# Patient Record
Sex: Male | Born: 1935 | Race: White | Hispanic: No | Smoking: Current every day smoker
Health system: Southern US, Community
[De-identification: ages and names within clinical notes are randomized; demographics above are authoritative.]

## PROBLEM LIST (undated history)

## (undated) DIAGNOSIS — N400 Enlarged prostate without lower urinary tract symptoms: Secondary | ICD-10-CM

## (undated) DIAGNOSIS — C3490 Malignant neoplasm of unspecified part of unspecified bronchus or lung: Secondary | ICD-10-CM

## (undated) DIAGNOSIS — J449 Chronic obstructive pulmonary disease, unspecified: Secondary | ICD-10-CM

## (undated) DIAGNOSIS — J60 Coalworker's pneumoconiosis: Secondary | ICD-10-CM

## (undated) DIAGNOSIS — J439 Emphysema, unspecified: Secondary | ICD-10-CM

## (undated) DIAGNOSIS — K219 Gastro-esophageal reflux disease without esophagitis: Secondary | ICD-10-CM

## (undated) DIAGNOSIS — F1910 Other psychoactive substance abuse, uncomplicated: Secondary | ICD-10-CM

## (undated) HISTORY — DX: Malignant neoplasm of unspecified part of unspecified bronchus or lung: C34.90

## (undated) HISTORY — DX: Benign prostatic hyperplasia without lower urinary tract symptoms: N40.0

## (undated) HISTORY — DX: Emphysema, unspecified: J43.9

## (undated) HISTORY — DX: Gastro-esophageal reflux disease without esophagitis: K21.9

## (undated) HISTORY — DX: Chronic obstructive pulmonary disease, unspecified: J44.9

## (undated) HISTORY — DX: Other psychoactive substance abuse, uncomplicated: F19.10

## (undated) HISTORY — PX: PARTIAL COLECTOMY: SHX5273

## (undated) HISTORY — DX: Coalworker's pneumoconiosis: J60

---

## 2016-10-02 ENCOUNTER — Encounter (HOSPITAL_COMMUNITY): Payer: Self-pay | Admitting: Oncology

## 2016-10-02 ENCOUNTER — Encounter (HOSPITAL_COMMUNITY): Payer: Federal, State, Local not specified - PPO | Attending: Oncology | Admitting: Oncology

## 2016-10-02 ENCOUNTER — Encounter (HOSPITAL_COMMUNITY): Payer: Federal, State, Local not specified - PPO

## 2016-10-02 VITALS — BP 116/61 | HR 97 | Temp 98.7°F | Resp 18 | Ht 67.0 in | Wt 141.0 lb

## 2016-10-02 DIAGNOSIS — G620 Drug-induced polyneuropathy: Secondary | ICD-10-CM

## 2016-10-02 DIAGNOSIS — J60 Coalworker's pneumoconiosis: Secondary | ICD-10-CM

## 2016-10-02 DIAGNOSIS — R071 Chest pain on breathing: Secondary | ICD-10-CM | POA: Diagnosis not present

## 2016-10-02 DIAGNOSIS — Z720 Tobacco use: Secondary | ICD-10-CM | POA: Diagnosis not present

## 2016-10-02 DIAGNOSIS — N400 Enlarged prostate without lower urinary tract symptoms: Secondary | ICD-10-CM | POA: Insufficient documentation

## 2016-10-02 DIAGNOSIS — K219 Gastro-esophageal reflux disease without esophagitis: Secondary | ICD-10-CM | POA: Diagnosis not present

## 2016-10-02 DIAGNOSIS — C3490 Malignant neoplasm of unspecified part of unspecified bronchus or lung: Secondary | ICD-10-CM

## 2016-10-02 DIAGNOSIS — J449 Chronic obstructive pulmonary disease, unspecified: Secondary | ICD-10-CM

## 2016-10-02 DIAGNOSIS — I4510 Unspecified right bundle-branch block: Secondary | ICD-10-CM | POA: Diagnosis not present

## 2016-10-02 DIAGNOSIS — C3492 Malignant neoplasm of unspecified part of left bronchus or lung: Secondary | ICD-10-CM | POA: Diagnosis present

## 2016-10-02 HISTORY — DX: Coalworker's pneumoconiosis: J60

## 2016-10-02 HISTORY — DX: Benign prostatic hyperplasia without lower urinary tract symptoms: N40.0

## 2016-10-02 HISTORY — DX: Malignant neoplasm of unspecified part of unspecified bronchus or lung: C34.90

## 2016-10-02 LAB — CBC WITH DIFFERENTIAL/PLATELET
Basophils Absolute: 0 10*3/uL (ref 0.0–0.1)
Basophils Relative: 0 %
Eosinophils Absolute: 0.8 10*3/uL — ABNORMAL HIGH (ref 0.0–0.7)
Eosinophils Relative: 8 %
HCT: 40.9 % (ref 39.0–52.0)
Hemoglobin: 13.7 g/dL (ref 13.0–17.0)
Lymphocytes Relative: 34 %
Lymphs Abs: 3.4 10*3/uL (ref 0.7–4.0)
MCH: 29.4 pg (ref 26.0–34.0)
MCHC: 33.5 g/dL (ref 30.0–36.0)
MCV: 87.8 fL (ref 78.0–100.0)
Monocytes Absolute: 0.8 10*3/uL (ref 0.1–1.0)
Monocytes Relative: 8 %
Neutro Abs: 5 10*3/uL (ref 1.7–7.7)
Neutrophils Relative %: 50 %
Platelets: 209 10*3/uL (ref 150–400)
RBC: 4.66 MIL/uL (ref 4.22–5.81)
RDW: 14.3 % (ref 11.5–15.5)
WBC: 10 10*3/uL (ref 4.0–10.5)

## 2016-10-02 LAB — COMPREHENSIVE METABOLIC PANEL
ALT: 13 U/L — AB (ref 17–63)
ANION GAP: 6 (ref 5–15)
AST: 17 U/L (ref 15–41)
Albumin: 3.6 g/dL (ref 3.5–5.0)
Alkaline Phosphatase: 88 U/L (ref 38–126)
BUN: 21 mg/dL — ABNORMAL HIGH (ref 6–20)
CHLORIDE: 103 mmol/L (ref 101–111)
CO2: 27 mmol/L (ref 22–32)
CREATININE: 0.98 mg/dL (ref 0.61–1.24)
Calcium: 9 mg/dL (ref 8.9–10.3)
GFR calc non Af Amer: >60 mL/min (ref >60)
Glucose, Bld: 81 mg/dL (ref 65–99)
POTASSIUM: 4 mmol/L (ref 3.5–5.1)
SODIUM: 136 mmol/L (ref 135–145)
Total Bilirubin: 0.6 mg/dL (ref 0.3–1.2)
Total Protein: 8.4 g/dL — ABNORMAL HIGH (ref 6.5–8.1)

## 2016-10-02 LAB — TSH: TSH: 3.129 u[IU]/mL (ref 0.350–4.500)

## 2016-10-02 MED ORDER — LIDOCAINE-PRILOCAINE 2.5-2.5 % EX CREA: 1.0000 "application " | TOPICAL_CREAM | CUTANEOUS | 2 refills | Status: AC | PRN

## 2016-10-02 MED ORDER — ALBUTEROL SULFATE HFA 108 (90 BASE) MCG/ACT IN AERS: 2.0000 | INHALATION_SPRAY | Freq: Four times a day (QID) | RESPIRATORY_TRACT | 0 refills | Status: AC | PRN

## 2016-10-02 MED ORDER — OMEPRAZOLE 20 MG PO CPDR: 20.0000 mg | DELAYED_RELEASE_CAPSULE | Freq: Every day | ORAL | 0 refills | Status: DC

## 2016-10-02 MED ORDER — HYDROCODONE-HOMATROPINE 5-1.5 MG/5ML PO SYRP: 5.0000 mL | ORAL_SOLUTION | Freq: Four times a day (QID) | ORAL | 0 refills | Status: DC | PRN

## 2016-10-02 NOTE — Progress Notes (Signed)
START OFF PATHWAY REGIMEN - Non-Small Cell Lung   [id number]:Nivolumab 240 mg q14 Days:   A cycle is every 14 days:     Nivolumab   **Always confirm dose/schedule in your pharmacy ordering system**    Patient Characteristics: Stage IV Metastatic, Non Squamous, Second Line - Chemotherapy/Immunotherapy, PS = 0, 1, No Prior PD-1/PD-L1  Inhibitor and Immunotherapy Candidate AJCC T Category: T2a Current Disease Status: Distant Metastases AJCC N Category: N0 AJCC M Category: M1a AJCC 8 Stage Grouping: IVA Histology: Non Squamous Cell ROS1 Rearrangement Status: Negative T790M Mutation Status: Not Applicable - EGFR Mutation Negative/Unknown Other Mutations/Biomarkers: No Other Actionable Mutations PD-L1 Expression Status: PD-L1 Negative Chemotherapy/Immunotherapy LOT: Second Line Chemotherapy/Immunotherapy Molecular Targeted Therapy: Not Appropriate ALK Translocation Status: Negative Would you be surprised if this patient died  in the next year? I would NOT be surprised if this patient died in the next year EGFR Mutation Status: Negative/Wild Type BRAF V600E Mutation Status: Did Not Order Test Performance Status: PS = 0, 1 Immunotherapy Candidate Status: Candidate for Immunotherapy Prior Immunotherapy Status: No Prior PD-1/PD-L1 Inhibitor  Intent of Therapy: Non-Curative / Palliative Intent, Discussed with Patient

## 2016-10-02 NOTE — Patient Instructions (Signed)
[name] at [facility] Discharge Instructions  RECOMMENDATIONS MADE BY THE CONSULTANT AND ANY TEST RESULTS WILL BE SENT TO YOUR REFERRING PHYSICIAN.  You were seen today by Kirby Crigler PA-C. Labs today, we will call you with results. Rx given today for Hycodan and EMLA cream. Treatment this Friday 10/04/16, then every 2 weeks. Return in 4 weeks for follow up.   Thank you for choosing [place] at [facility] to provide your oncology and hematology care.  To afford each patient quality time with our provider, please arrive at least 15 minutes before your scheduled appointment time.    If you have a lab appointment with the [facility] please come in thru the  Main Entrance and check in at the main information desk  You need to re-schedule your appointment should you arrive 10 or more minutes late.  We strive to give you quality time with our providers, and arriving late affects you and other patients whose appointments are after yours.  Also, if you no show three or more times for appointments you may be dismissed from the clinic at the providers discretion.     Again, thank you for choosing [facility].  Our hope is that these requests will decrease the amount of time that you wait before being seen by our physicians.       _____________________________________________________________  Should you have questions after your visit to [facility], please contact our office at [phone] between the hours of 8:30 a.m. and 4:30 p.m.  Voicemails left after 4:30 p.m. will not be returned until the following business day.  For prescription refill requests, have your pharmacy contact our office.       Resources For Cancer Patients and their Caregivers ? American Cancer Society: Can assist with transportation, wigs, general needs, runs Look Good Feel Better.        475-712-4392 ? Cancer Care: Provides financial  assistance, online support groups, medication/co-pay assistance.  1-800-813-HOPE (610)025-2715) ? [place] Assists [place] cancer patients and their families through emotional , educational and financial support.  403-872-9776 ? [place] DSS Where to apply for food stamps, Medicaid and utility assistance. 307-175-5815 ? RCATS: Transportation to medical appointments. [phone] ? Social Security Administration: May apply for disability if have a Stage IV cancer. [phone] [phone] ? [employer], Disability and Transit Services: Assists with nutrition, care and transit needs. [facility] Support Programs: '@10RELATIVEDAYS'$ @ > Cancer Support Group  2nd Tuesday of the month 1pm-2pm, Journey Room  > Creative Journey  3rd Tuesday of the month 1130am-1pm, Journey Room  > Look Good Feel Better  1st Wednesday of the month 10am-12 noon, Journey Room (Call [place] to register [phone])

## 2016-10-02 NOTE — Progress Notes (Signed)
[facility] Hematology/Oncology Consultation   Name: Kierre Hintz      MRN: [id number]    Location: Room/bed info not found  Date: 10/02/2016 Time:2:14 PM   REFERRING PHYSICIAN:  Evalee Mutton, MD (Medical Oncology at [facility] [place])  REASON FOR CONSULT:  Metastatic non-small cell lung cancer   DIAGNOSIS:  Stage IV Metastatic non-small cell lung cancer  HISTORY OF PRESENT ILLNESS:   Rigo Letts is a 81 y.o. male with a medical history significant for BPH, GERD, COPD/Emphysema, black lung from coal mining, H/O partial colectomy from colitis who is referred to the [facility] for ongoing medical oncology care for Stage IV, metastatic NSCLC, adenocarcinoma (T2AN0M1A) of left lung with disease in the contralateral lung, S/P Carboplatin/Paclitaxel/Avastin x 3 cycles (05/30/16- 07/11/2016) with documented progression of disease of CT imaging without disease identified inferior to the diaphragm.  Started immunotherapy on 08/08/2016 with Nivolumab.    Non-small cell lung cancer ([place])   03/24/2015 Procedure    Needle biopsy of left upper lobe lesion.      03/24/2015 Pathology Results    Well differentiated adenocarcinoma.      03/11/2016 Pathology Results    ROS1 negative.  EGFR negative. PDL1 0%.  ALK negative.      05/30/2016 - 07/11/2016 Chemotherapy    [facility]- Carboplatin/Paclitaxel/Avastin every 3 weeks, x 3 cycles      07/30/2016 Progression    CT imaging demonstrated progression of disease.  Lung nodules increased in size in a couple of new nodules as well.  His primary lingular mass has increased in size as well.  His abdomen/pelvis was negative for malignancy.      07/30/2016 Imaging    [facility]- CT CAP-no significant change in dominant mass within the lingula measuring 3.6 x 2.1 cm.  Increase in size and number of multiple pulmonary nodules bilaterally compatible with progression.  No CT evidence of metastatic disease within the abdomen or pelvis.      08/08/2016 -  Chemotherapy    [facility]- Opdivo every 2 weeks (approximate start date)        Oncology history reviewed with the patient.  He was initially diagnosed with adenocarcinoma of left lung on biopsy in 2016.  The patient decided to pursue alternative treatment in [place].  He reports that it was an "immune-booster treatment(s)."  Of course, this unfortunately failed and therefore he was started on traditional systemic chemotherapy for adenocarcinoma histology consisting of Carboplatin/Alimta/Avastin x 3 cycles.  Response evaluation with imaging performed following 3 cycles of chemotherapy demonstrated progression of disease.  He was therefore started on second-line treatment with Opdivo in jan 2018.  His tumor was ALK, EGFR, ROS1, and PDL1 NEGATIVE.  Patient is here in [place] visiting his son, Elta [place].  He will be here until May at which time he will return to [place].  He has follow-up appointment with Dr. Oliva Bustard at [facility] on 12/02/2016 at which time, the patient will be set-up for restaging tests (per patient report).  Thus far, he has tolerated treatment well without any complications.  He reports intermittent loose stools, but this resolves spontaneously.  He denies any change in fatigue.  He notes a chronic fatigue, but he is currently at baseline.  He notes that his appetite is stable and his weight is stable.  He denies any nausea or vomiting.    He reports a 3-4 day history of chest pain with SOB.  He notes that it has  resolved and is no longer present.  He describes it as a pressure sensation.  He denies LOC or diaphoresis.  He denies any of the issues below related to immunotherapy: Nivolumab side effects:  Lung problems (pneumonitis):   Shortness of breath   Chest pain    New or worse cough  Intestinal problems (colitis):   Diarrhea or more bowel movements than usual   Stools that are black, tarry, sticky, or have bloodwork mucous   Severe stomach (abdominal) pain  or tenderness  Liver problems (hepatitis):   Yellowing of skin or the sclera of eyes   Nausea or vomiting   Pain on the right side of abdomen   Dark urine   Feeling less hungry than usual   Bleeding or bruising more easily than normal  Hormone gland problems (thyroid, pituitary, adrenal glands, and pancreas):   Rapid heartbeat   Weight loss or weight gain   Increased sweating   Feeling more hungry or thirsty   Urinating more than usual   Hair loss   Feeling cold   Constipation   Voice changes (voice getting deeper)   Muscle aches   Dizziness or fainting   Headaches that will not go away, or unusual headaches  Kidney problems (nephritis and kidney failure):   Change in amount of urine or color of urine   Problems and other organs:   Rash   Change in eyesight   Severe or persistent muscle or joint pains   Severe muscle weakness  Infusion (IV) reactions:   Chills or shaking   Shortness of breath or wheezing   Itching or rash   Flushing   Dizziness   Fevers   Feeling like passing out    Review of Systems  Constitutional: Positive for malaise/fatigue. Negative for chills, fever and weight loss.  HENT: Negative.   Eyes: Negative.   Respiratory: Positive for cough and shortness of breath. Negative for hemoptysis.   Cardiovascular: Positive for chest pain (not presently).  Gastrointestinal: Positive for diarrhea (intermittent). Negative for blood in stool, constipation, melena, nausea and vomiting.  Genitourinary: Negative.   Musculoskeletal: Negative.   Skin: Negative.   Neurological: Negative.  Negative for weakness.  Endo/Heme/Allergies: Negative.   Psychiatric/Behavioral: Negative.     PAST MEDICAL HISTORY:   Past Medical History:  Diagnosis Date  . Black lung disease ([place]) 10/02/2016  . BPH (benign prostatic hyperplasia) 10/02/2016  . COPD (chronic obstructive pulmonary disease) ([place])   . Emphysema of lung ([place])   . GERD (gastroesophageal reflux disease)   .  Non-small cell lung cancer ([place]) 10/02/2016  . Substance abuse    tobacco    ALLERGIES: Not on File    MEDICATIONS: I have reviewed the patient's current medications.    No current outpatient prescriptions on file prior to visit.   No current facility-administered medications on file prior to visit.      PAST SURGICAL HISTORY Past Surgical History:  Procedure Laterality Date  . PARTIAL COLECTOMY      FAMILY HISTORY: Mother deceased at the age of 79 secondary to heart disease. Father deceased at age 41 secondary to "old age." He has 4 brothers and 2 sisters. He has 2 children, one son and one daughter.  SOCIAL HISTORY:  reports that he has been smoking Cigarettes.  He has a 30.00 pack-year smoking history. He has never used smokeless tobacco. He reports that he does not drink alcohol or use drugs.  He has [place] religion.  He is retired  but used to work in the [employer].  He is a widower with his wife passing at the age of 42 secondary to metastatic breast cancer.  Social History   Social History  . Marital status: Widowed    Spouse name: N/A  . Number of children: N/A  . Years of education: N/A   Social History Main Topics  . Smoking status: Current Every Day Smoker    Packs/day: 0.50    Years: 60.00    Types: Cigarettes  . Smokeless tobacco: Never Used  . Alcohol use No  . Drug use: No  . Sexual activity: Not Asked   Other Topics Concern  . None   Social History Narrative  . None    PERFORMANCE STATUS: The patient's performance status is 1 - Symptomatic but completely ambulatory  PHYSICAL EXAM: Most Recent Vital Signs: Blood pressure 116/61, pulse 97, temperature 98.7 F (37.1 C), temperature source Oral, resp. rate 18, height _0  (1.702 m), weight 141 lb (64 kg), SpO2 94 %. BP 116/61 (BP Location: Right Arm, Patient Position: Sitting)   Pulse 97   Temp 98.7 F (37.1 C) (Oral)   Resp 18   Ht _1  (1.702 m)   Wt 141 lb (64 kg)   SpO2 94%   BMI  22.08 kg/m   General Appearance:    Alert, cooperative, no distress, appears stated age, accompanied by son, MARK.  Chronically ill appearing.  Head:    Normocephalic, without obvious abnormality, atraumatic  Eyes:    PERRL, conjunctiva/corneas clear, EOM's intact, both eyes       Ears:    Not examined  Nose:   Not examined  Throat:   Lips, mucosa, and tongue normal  Neck:   Supple, symmetrical, trachea midline, no adenopathy;       thyroid:  No enlargement/tenderness/nodules.  Back:     Symmetric, no curvature, ROM normal, no CVA tenderness  Lungs:     Clear to auscultation bilaterally, respirations unlabored, decreased breath sounds bilaterally.  Chest wall:    No tenderness or deformity.  Port in place on right side of chest.  Heart:    Regular rate and rhythm, S1 and S2 normal, no murmur, rub   or gallop  Abdomen:     Soft, non-tender, bowel sounds active all four quadrants,    no masses, no organomegaly  Genitalia:    Not examined  Rectal:    Not examined  Extremities:   Extremities normal, atraumatic, no cyanosis or edema  Pulses:   2+ and symmetric all extremities  Skin:   Skin color, texture, turgor normal, no rashes or lesions  Lymph nodes:   Cervical, supraclavicular, and axillary nodes normal  Neurologic:   CNII-XII intact. Normal strength, sensation and reflexes      throughout    LABORATORY DATA:  Results for orders placed or performed in visit on 10/02/16 (from the past 48 hour(s))  CBC with Differential     Status: Abnormal   Collection Time: 10/02/16  1:11 PM  Result Value Ref Range   WBC 10.0 4.0 - 10.5 K/uL   RBC 4.66 4.22 - 5.81 MIL/uL   Hemoglobin 13.7 13.0 - 17.0 g/dL   HCT 40.9 39.0 - 52.0 %   MCV 87.8 78.0 - 100.0 fL   MCH 29.4 26.0 - 34.0 pg   MCHC 33.5 30.0 - 36.0 g/dL   RDW 14.3 11.5 - 15.5 %   Platelets 209 150 - 400 K/uL   Neutrophils  Relative % 50 %   Neutro Abs 5.0 1.7 - 7.7 K/uL   Lymphocytes Relative 34 %   Lymphs Abs 3.4 0.7 - 4.0 K/uL    Monocytes Relative 8 %   Monocytes Absolute 0.8 0.1 - 1.0 K/uL   Eosinophils Relative 8 %   Eosinophils Absolute 0.8 (H) 0.0 - 0.7 K/uL   Basophils Relative 0 %   Basophils Absolute 0.0 0.0 - 0.1 K/uL  Comprehensive metabolic panel     Status: Abnormal   Collection Time: 10/02/16  1:11 PM  Result Value Ref Range   Sodium 136 135 - 145 mmol/L   Potassium 4.0 3.5 - 5.1 mmol/L   Chloride 103 101 - 111 mmol/L   CO2 27 22 - 32 mmol/L   Glucose, Bld 81 65 - 99 mg/dL   BUN 21 (H) 6 - 20 mg/dL   Creatinine, Ser 0.98 0.61 - 1.24 mg/dL   Calcium 9.0 8.9 - 10.3 mg/dL   Total Protein 8.4 (H) 6.5 - 8.1 g/dL   Albumin 3.6 3.5 - 5.0 g/dL   AST 17 15 - 41 U/L   ALT 13 (L) 17 - 63 U/L   Alkaline Phosphatase 88 38 - 126 U/L   Total Bilirubin 0.6 0.3 - 1.2 mg/dL   GFR calc non Af Amer >60 >60 mL/min   GFR calc Af Amer >60 >60 mL/min    Comment: (NOTE) The eGFR has been calculated using the CKD EPI equation. This calculation has not been validated in all clinical situations. eGFR's persistently <60 mL/min signify possible Chronic Kidney Disease.    Anion gap 6 5 - 15  TSH     Status: None   Collection Time: 10/02/16  1:11 PM  Result Value Ref Range   TSH 3.129 0.350 - 4.500 uIU/mL    Comment: Performed by a 3rd Generation assay with a functional sensitivity of <=0.01 uIU/mL.      RADIOGRAPHY:           PATHOLOGY:              PAST VIA PATHWAY TREATMENT AT [facility]:            ASSESSMENT/PLAN:   Non-small cell lung cancer ([place]) Stage IV, metastatic NSCLC, adenocarcinoma (T2AN0M1A) of left lung with disease in the contralateral lung, S/P Carboplatin/Paclitaxel/Avastin x 3 cycles (05/30/16- 07/11/2016) with documented progression of disease of CT imaging without disease identified inferior to the diaphragm.  Started immunotherapy on 08/08/2016 with Nivolumab. AND Grade I peripheral neuropathy secondary to past Paclitaxel chemotherapy, patient refusing symptomatic  management of this issue at this time. AND Other chronic medical issues including:BPH, GERD, COPD/Emphysema, black lung from coal mining, H/O partial colectomy from colitis  AND Recent episode of chest pain with shortness of breath last 3-4 days without medical attention.  Pre-treatment labs today: CBC diff, CMET, TSH.   Future labs placed as well for treatment.  Patient wishes to have labs performed the day prior to treatment to decrease treatment day time.  EKG today.    Medications provided today:  1. EMLA cream- to numb port site prior to treatment/labs.  2. Hycodan- for chronic cough  Nivolumab treatment plan is built via VIA pathways.  Start treatment on Friday, 10/04/2016.  Patient then wishes to be treated on Thursdays.  We will accommodate that for future treatments.  He will return to Dr. Oliva Bustard (primary medical oncology at [facility]) as scheduled on 12/02/2016.  Restaging tests will be performed in [place] (unless otherwise indicated).  He anticipates returning to [place] in September 2018.  Return on Friday for treatment, 10/04/2016.  Nivolumab with pre-treatment labs every 2 weeks.  Return for follow-up in 4 weeks, sooner if indicated/needed.  Will fax copy of today's dictation to Dr. Evalee Mutton at [facility].  Fax#: [phone]    ORDERS PLACED FOR THIS ENCOUNTER: Orders Placed This Encounter  Procedures  . CBC with Differential  . Comprehensive metabolic panel  . TSH  . CBC with Differential  . Comprehensive metabolic panel  . TSH  . EKG 12-Lead  . EKG 12-Lead  . EKG 12-Lead    MEDICATIONS PRESCRIBED THIS ENCOUNTER: Meds ordered this encounter  Medications  . lidocaine-prilocaine (EMLA) cream    Sig: Apply 1 application topically as needed.    Dispense:  30 g    Refill:  2    Order Specific Question:   Supervising Provider    Answer:   Brunetta Genera [[id number]]  . HYDROcodone-homatropine (HYCODAN) 5-1.5 MG/5ML syrup    Sig: Take  5 mLs by mouth every 6 (six) hours as needed for cough.    Dispense:  240 mL    Refill:  0    Order Specific Question:   Supervising Provider    Answer:   Brunetta Genera [[id number]]  . omeprazole (PRILOSEC) 20 MG capsule    Sig: Take 1 capsule (20 mg total) by mouth daily.    Dispense:  30 capsule    Refill:  0    Order Specific Question:   Supervising Provider    Answer:   Brunetta Genera [[id number]]  . albuterol (PROVENTIL HFA;VENTOLIN HFA) 108 (90 Base) MCG/ACT inhaler    Sig: Inhale 2 puffs into the lungs every 6 (six) hours as needed for wheezing or shortness of breath.    Dispense:  1 Inhaler    Refill:  0    Order Specific Question:   Supervising Provider    Answer:   Brunetta Genera [[id number]]    All questions were answered. The patient knows to call the clinic with any problems, questions or concerns. We can certainly see the patient much sooner if necessary.  Patient discussed with Dr. Talbert Cage and together we ascertained an up-to-date interval history, and examined the patient.  Dr. Talbert Cage developed the patient's assessment and plan.  This was a shared visit-consultation.  Her attestation will follow below.  This note is electronically signed by: Doy Mince 10/02/2016 2:14 PM

## 2016-10-02 NOTE — Assessment & Plan Note (Addendum)
Stage IV, metastatic NSCLC, adenocarcinoma (T2AN0M1A) of left lung with disease in the contralateral lung, S/P Carboplatin/Paclitaxel/Avastin x 3 cycles (05/30/16- 07/11/2016) with documented progression of disease of CT imaging without disease identified inferior to the diaphragm.  Started immunotherapy on 08/08/2016 with Nivolumab. 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 AND Other chronic medical issues including:BPH, GERD, COPD/Emphysema, black lung from coal mining, H/O partial colectomy from colitis  AND Recent episode of chest pain with shortness of breath last 3-4 days without medical attention.  Pre-treatment labs today: CBC diff, CMET, TSH.   Future labs placed as well for treatment.  Patient wishes to have labs performed the day prior to treatment to decrease treatment day time.  EKG today.    Medications provided today:  1. EMLA cream- to numb port site prior to treatment/labs.  2. Hycodan- for chronic cough  Nivolumab treatment plan is built via VIA pathways.  Start treatment on Friday, 10/04/2016.  Patient then wishes to be treated on Thursdays.  We will accommodate that for future treatments.  He will return to Dr. Oliva Bustard (primary medical oncology at [facility]) as scheduled on 12/02/2016.  Restaging tests will be performed in [place] (unless otherwise indicated).  He anticipates returning to [place] in September 2018.  Return on Friday for treatment, 10/04/2016.  Nivolumab with pre-treatment labs every 2 weeks.  Return for follow-up in 4 weeks, sooner if indicated/needed.  Will fax copy of today's dictation to Dr. Evalee Mutton at [facility].  Fax#: [phone]

## 2016-10-04 ENCOUNTER — Encounter (HOSPITAL_COMMUNITY): Payer: Federal, State, Local not specified - PPO | Attending: Oncology

## 2016-10-04 VITALS — BP 102/47 | HR 68 | Temp 98.0°F | Resp 16 | Wt 140.2 lb

## 2016-10-04 DIAGNOSIS — Z5112 Encounter for antineoplastic immunotherapy: Secondary | ICD-10-CM

## 2016-10-04 DIAGNOSIS — C3492 Malignant neoplasm of unspecified part of left bronchus or lung: Secondary | ICD-10-CM

## 2016-10-04 MED ORDER — SODIUM CHLORIDE 0.9 % IV SOLN
Freq: Once | INTRAVENOUS | Status: AC
  Administered 2016-10-04: 10:00:00 via INTRAVENOUS

## 2016-10-04 MED ORDER — SODIUM CHLORIDE 0.9 % IV SOLN
240.0000 mg | Freq: Once | INTRAVENOUS | Status: AC
  Administered 2016-10-04: 240 mg via INTRAVENOUS
  Filled 2016-10-04: qty 24

## 2016-10-04 MED ORDER — HEPARIN SOD (PORK) LOCK FLUSH 100 UNIT/ML IV SOLN
INTRAVENOUS | Status: AC
  Filled 2016-10-04: qty 5

## 2016-10-04 MED ORDER — HEPARIN SOD (PORK) LOCK FLUSH 100 UNIT/ML IV SOLN
500.0000 [IU] | Freq: Once | INTRAVENOUS | Status: AC | PRN
  Administered 2016-10-04: 500 [IU]

## 2016-10-04 NOTE — Patient Instructions (Signed)
[facility] Discharge Instructions for Patients Receiving Chemotherapy   Beginning January 23rd 2017 lab work for the [facility] will be done in the  Main lab at [facility] on 1st floor. If you have a lab appointment with the [facility] please come in thru the  Main Entrance and check in at the main information desk   Today you received the following chemotherapy agents Opdivo.  To help prevent nausea and vomiting after your treatment, we encourage you to take your nausea medication as instructed.   If you develop nausea and vomiting, or diarrhea that is not controlled by your medication, call the clinic.  The clinic phone number is [phone]. Office hours are Monday-Friday 8:30am-5:00pm.  BELOW ARE SYMPTOMS THAT SHOULD BE REPORTED IMMEDIATELY:  *FEVER GREATER THAN 101.0 F  *CHILLS WITH OR WITHOUT FEVER  NAUSEA AND VOMITING THAT IS NOT CONTROLLED WITH YOUR NAUSEA MEDICATION  *UNUSUAL SHORTNESS OF BREATH  *UNUSUAL BRUISING OR BLEEDING  TENDERNESS IN MOUTH AND THROAT WITH OR WITHOUT PRESENCE OF ULCERS  *URINARY PROBLEMS  *BOWEL PROBLEMS  UNUSUAL RASH Items with * indicate a potential emergency and should be followed up as soon as possible. If you have an emergency after office hours please contact your primary care physician or go to the nearest emergency department.  Please call the clinic during office hours if you have any questions or concerns.   You may also contact the Patient Navigator at [phone] should you have any questions or need assistance in obtaining follow up care.      Resources For Cancer Patients and their Caregivers ? American Cancer Society: Can assist with transportation, wigs, general needs, runs Look Good Feel Better.        825-242-5018 ? Cancer Care: Provides financial assistance, online support groups, medication/co-pay assistance.  1-800-813-HOPE 215-737-3464) ? [place] Assists  [place] cancer patients and their families through emotional , educational and financial support.  ([phone] [phone] ? [place] DSS Where to apply for food stamps, Medicaid and utility assistance. 629-877-9541 ? RCATS: Transportation to medical appointments. 206-777-8298 ? Social Security Administration: May apply for disability if have a Stage IV cancer. 612-846-6129 947-019-5990 ? [employer], Disability and Transit Services: Assists with nutrition, care and transit needs. [phone]

## 2016-10-04 NOTE — Progress Notes (Signed)
Tolerated opdivo infusion well. Stable and ambulatory on discharge home to self.

## 2016-10-07 ENCOUNTER — Telehealth (HOSPITAL_COMMUNITY): Payer: Self-pay | Admitting: *Deleted

## 2016-10-07 NOTE — Telephone Encounter (Signed)
24h call back: Patient was treated with Opdivo on Friday 10-04-16. Today is Monday 10/07/16. He reports no side effects however does report having some pain back in his chest. He said the pain in the chest was the same as before. I gave him the triage nurse and the Navigator's number so that he could call to report any problems. He said ok.

## 2016-10-16 ENCOUNTER — Encounter (HOSPITAL_COMMUNITY): Payer: Federal, State, Local not specified - PPO

## 2016-10-16 DIAGNOSIS — C3492 Malignant neoplasm of unspecified part of left bronchus or lung: Secondary | ICD-10-CM

## 2016-10-16 LAB — CBC WITH DIFFERENTIAL/PLATELET
BASOS ABS: 0 10*3/uL (ref 0.0–0.1)
Basophils Relative: 0 %
EOS ABS: 0.7 10*3/uL (ref 0.0–0.7)
EOS PCT: 8 %
HCT: 42.6 % (ref 39.0–52.0)
HEMOGLOBIN: 13.8 g/dL (ref 13.0–17.0)
LYMPHS ABS: 2.6 10*3/uL (ref 0.7–4.0)
Lymphocytes Relative: 30 %
MCH: 28.6 pg (ref 26.0–34.0)
MCHC: 32.4 g/dL (ref 30.0–36.0)
MCV: 88.4 fL (ref 78.0–100.0)
Monocytes Absolute: 0.7 10*3/uL (ref 0.1–1.0)
Monocytes Relative: 8 %
NEUTROS PCT: 54 %
Neutro Abs: 4.8 10*3/uL (ref 1.7–7.7)
PLATELETS: 190 10*3/uL (ref 150–400)
RBC: 4.82 MIL/uL (ref 4.22–5.81)
RDW: 14.6 % (ref 11.5–15.5)
WBC: 8.8 10*3/uL (ref 4.0–10.5)

## 2016-10-16 LAB — COMPREHENSIVE METABOLIC PANEL
ALBUMIN: 3.5 g/dL (ref 3.5–5.0)
ALK PHOS: 98 U/L (ref 38–126)
ALT: 12 U/L — AB (ref 17–63)
AST: 17 U/L (ref 15–41)
Anion gap: 6 (ref 5–15)
BUN: 16 mg/dL (ref 6–20)
CALCIUM: 9.2 mg/dL (ref 8.9–10.3)
CHLORIDE: 104 mmol/L (ref 101–111)
CO2: 28 mmol/L (ref 22–32)
CREATININE: 1 mg/dL (ref 0.61–1.24)
GFR calc Af Amer: >60 mL/min (ref >60)
GFR calc non Af Amer: >60 mL/min (ref >60)
Glucose, Bld: 129 mg/dL — ABNORMAL HIGH (ref 65–99)
Potassium: 3.7 mmol/L (ref 3.5–5.1)
SODIUM: 138 mmol/L (ref 135–145)
Total Bilirubin: 0.3 mg/dL (ref 0.3–1.2)
Total Protein: 8 g/dL (ref 6.5–8.1)

## 2016-10-16 LAB — TSH: TSH: 3.242 u[IU]/mL (ref 0.350–4.500)

## 2016-10-17 ENCOUNTER — Encounter (HOSPITAL_COMMUNITY): Payer: Self-pay

## 2016-10-17 ENCOUNTER — Encounter (HOSPITAL_BASED_OUTPATIENT_CLINIC_OR_DEPARTMENT_OTHER): Payer: Federal, State, Local not specified - PPO

## 2016-10-17 VITALS — BP 133/60 | HR 67 | Temp 97.8°F | Resp 18 | Wt 141.8 lb

## 2016-10-17 DIAGNOSIS — C3492 Malignant neoplasm of unspecified part of left bronchus or lung: Secondary | ICD-10-CM | POA: Diagnosis not present

## 2016-10-17 DIAGNOSIS — Z5112 Encounter for antineoplastic immunotherapy: Secondary | ICD-10-CM

## 2016-10-17 MED ORDER — SODIUM CHLORIDE 0.9 % IV SOLN
240.0000 mg | Freq: Once | INTRAVENOUS | Status: AC
  Administered 2016-10-17: 240 mg via INTRAVENOUS
  Filled 2016-10-17: qty 24

## 2016-10-17 MED ORDER — SODIUM CHLORIDE 0.9 % IV SOLN
Freq: Once | INTRAVENOUS | Status: AC
  Administered 2016-10-17: 14:00:00 via INTRAVENOUS

## 2016-10-17 MED ORDER — HEPARIN SOD (PORK) LOCK FLUSH 100 UNIT/ML IV SOLN
500.0000 [IU] | Freq: Once | INTRAVENOUS | Status: AC | PRN
  Administered 2016-10-17: 500 [IU]

## 2016-10-17 MED ORDER — SODIUM CHLORIDE 0.9% FLUSH
10.0000 mL | INTRAVENOUS | Status: DC | PRN
  Administered 2016-10-17: 10 mL
  Filled 2016-10-17: qty 10

## 2016-10-17 MED ORDER — HEPARIN SOD (PORK) LOCK FLUSH 100 UNIT/ML IV SOLN
INTRAVENOUS | Status: AC
  Filled 2016-10-17: qty 5

## 2016-10-17 NOTE — Progress Notes (Signed)
Koltin Vanhandel tolerated chemo tx well without complaints or incident. Labs from 10/16/16 reviewed with Dr. Irene Limbo prior to administering chemotherapy. VSS upon discharge. Pt discharged self ambulatory in satisfactory condition

## 2016-10-17 NOTE — Patient Instructions (Signed)
[facility] Discharge Instructions for Patients Receiving Chemotherapy   Beginning January 23rd 2017 lab work for the [facility] will be done in the  Main lab at [facility] on 1st floor. If you have a lab appointment with the [facility] please come in thru the  Main Entrance and check in at the main information desk   Today you received the following chemotherapy agents Opdivo. Follow-up as scheduled. Call clinic for any questions or concerns  To help prevent nausea and vomiting after your treatment, we encourage you to take your nausea medication   If you develop nausea and vomiting, or diarrhea that is not controlled by your medication, call the clinic.  The clinic phone number is [phone]. Office hours are Monday-Friday 8:30am-5:00pm.  BELOW ARE SYMPTOMS THAT SHOULD BE REPORTED IMMEDIATELY:  *FEVER GREATER THAN 101.0 F  *CHILLS WITH OR WITHOUT FEVER  NAUSEA AND VOMITING THAT IS NOT CONTROLLED WITH YOUR NAUSEA MEDICATION  *UNUSUAL SHORTNESS OF BREATH  *UNUSUAL BRUISING OR BLEEDING  TENDERNESS IN MOUTH AND THROAT WITH OR WITHOUT PRESENCE OF ULCERS  *URINARY PROBLEMS  *BOWEL PROBLEMS  UNUSUAL RASH Items with * indicate a potential emergency and should be followed up as soon as possible. If you have an emergency after office hours please contact your primary care physician or go to the nearest emergency department.  Please call the clinic during office hours if you have any questions or concerns.   You may also contact the Patient Navigator at [phone] should you have any questions or need assistance in obtaining follow up care.      Resources For Cancer Patients and their Caregivers ? American Cancer Society: Can assist with transportation, wigs, general needs, runs Look Good Feel Better.        316-501-4829 ? Cancer Care: Provides financial assistance, online support groups, medication/co-pay assistance.  1-800-813-HOPE  339-858-3888) ? [facility] Assists [place] cancer patients and their families through emotional , educational and financial support.  203-629-3344 ? [place] DSS Where to apply for food stamps, Medicaid and utility assistance. ([phone] ? RCATS: Transportation to medical appointments. 801-817-0924 ? Social Security Administration: May apply for disability if have a Stage IV cancer. 210-512-7714 252-769-8486 ? [employer], Disability and Transit Services: Assists with nutrition, care and transit needs. [phone]

## 2016-10-18 ENCOUNTER — Ambulatory Visit (HOSPITAL_COMMUNITY): Payer: Self-pay

## 2016-10-30 ENCOUNTER — Other Ambulatory Visit (HOSPITAL_COMMUNITY): Payer: Self-pay | Admitting: Oncology

## 2016-10-30 ENCOUNTER — Encounter (HOSPITAL_COMMUNITY): Payer: Federal, State, Local not specified - PPO | Attending: Oncology

## 2016-10-30 DIAGNOSIS — E876 Hypokalemia: Secondary | ICD-10-CM

## 2016-10-30 DIAGNOSIS — C3492 Malignant neoplasm of unspecified part of left bronchus or lung: Secondary | ICD-10-CM | POA: Insufficient documentation

## 2016-10-30 LAB — COMPREHENSIVE METABOLIC PANEL
ALBUMIN: 3.3 g/dL — AB (ref 3.5–5.0)
ALK PHOS: 92 U/L (ref 38–126)
ALT: 11 U/L — ABNORMAL LOW (ref 17–63)
ANION GAP: 10 (ref 5–15)
AST: 19 U/L (ref 15–41)
BUN: 22 mg/dL — ABNORMAL HIGH (ref 6–20)
CO2: 25 mmol/L (ref 22–32)
Calcium: 9.1 mg/dL (ref 8.9–10.3)
Chloride: 104 mmol/L (ref 101–111)
Creatinine, Ser: 1.02 mg/dL (ref 0.61–1.24)
GFR calc non Af Amer: >60 mL/min (ref >60)
Glucose, Bld: 109 mg/dL — ABNORMAL HIGH (ref 65–99)
Potassium: 3.2 mmol/L — ABNORMAL LOW (ref 3.5–5.1)
SODIUM: 139 mmol/L (ref 135–145)
Total Bilirubin: 0.2 mg/dL — ABNORMAL LOW (ref 0.3–1.2)
Total Protein: 7.5 g/dL (ref 6.5–8.1)

## 2016-10-30 LAB — CBC WITH DIFFERENTIAL/PLATELET
BASOS ABS: 0 10*3/uL (ref 0.0–0.1)
Basophils Relative: 1 %
EOS ABS: 0.4 10*3/uL (ref 0.0–0.7)
Eosinophils Relative: 6 %
HCT: 41.4 % (ref 39.0–52.0)
HEMOGLOBIN: 13.5 g/dL (ref 13.0–17.0)
LYMPHS ABS: 2.2 10*3/uL (ref 0.7–4.0)
LYMPHS PCT: 32 %
MCH: 28.6 pg (ref 26.0–34.0)
MCHC: 32.6 g/dL (ref 30.0–36.0)
MCV: 87.7 fL (ref 78.0–100.0)
Monocytes Absolute: 0.6 10*3/uL (ref 0.1–1.0)
Monocytes Relative: 8 %
NEUTROS PCT: 53 %
Neutro Abs: 3.8 10*3/uL (ref 1.7–7.7)
Platelets: 155 10*3/uL (ref 150–400)
RBC: 4.72 MIL/uL (ref 4.22–5.81)
RDW: 14.5 % (ref 11.5–15.5)
WBC: 7.1 10*3/uL (ref 4.0–10.5)

## 2016-10-30 LAB — TSH: TSH: 2.991 u[IU]/mL (ref 0.350–4.500)

## 2016-10-30 MED ORDER — POTASSIUM CHLORIDE CRYS ER 20 MEQ PO TBCR: 20.0000 meq | EXTENDED_RELEASE_TABLET | Freq: Two times a day (BID) | ORAL | 0 refills | Status: DC

## 2016-10-31 ENCOUNTER — Encounter (HOSPITAL_BASED_OUTPATIENT_CLINIC_OR_DEPARTMENT_OTHER): Payer: Federal, State, Local not specified - PPO

## 2016-10-31 ENCOUNTER — Encounter (HOSPITAL_BASED_OUTPATIENT_CLINIC_OR_DEPARTMENT_OTHER): Payer: Federal, State, Local not specified - PPO | Admitting: Oncology

## 2016-10-31 ENCOUNTER — Encounter (HOSPITAL_COMMUNITY): Payer: Self-pay

## 2016-10-31 VITALS — BP 123/45 | HR 47 | Temp 97.4°F | Resp 20

## 2016-10-31 DIAGNOSIS — R05 Cough: Secondary | ICD-10-CM

## 2016-10-31 DIAGNOSIS — C3492 Malignant neoplasm of unspecified part of left bronchus or lung: Secondary | ICD-10-CM | POA: Diagnosis not present

## 2016-10-31 DIAGNOSIS — Z5112 Encounter for antineoplastic immunotherapy: Secondary | ICD-10-CM | POA: Diagnosis not present

## 2016-10-31 DIAGNOSIS — K219 Gastro-esophageal reflux disease without esophagitis: Secondary | ICD-10-CM

## 2016-10-31 DIAGNOSIS — Z720 Tobacco use: Secondary | ICD-10-CM

## 2016-10-31 LAB — CBC WITH DIFFERENTIAL/PLATELET
Basophils Absolute: 0.1 10*3/uL (ref 0.0–0.1)
Basophils Relative: 1 %
EOS ABS: 0.4 10*3/uL (ref 0.0–0.7)
Eosinophils Relative: 5 %
HEMATOCRIT: 41.6 % (ref 39.0–52.0)
HEMOGLOBIN: 13.6 g/dL (ref 13.0–17.0)
LYMPHS ABS: 2.1 10*3/uL (ref 0.7–4.0)
LYMPHS PCT: 27 %
MCH: 28.7 pg (ref 26.0–34.0)
MCHC: 32.7 g/dL (ref 30.0–36.0)
MCV: 87.8 fL (ref 78.0–100.0)
MONOS PCT: 9 %
Monocytes Absolute: 0.7 10*3/uL (ref 0.1–1.0)
NEUTROS ABS: 4.5 10*3/uL (ref 1.7–7.7)
NEUTROS PCT: 58 %
Platelets: 147 10*3/uL — ABNORMAL LOW (ref 150–400)
RBC: 4.74 MIL/uL (ref 4.22–5.81)
RDW: 14.5 % (ref 11.5–15.5)
WBC: 7.7 10*3/uL (ref 4.0–10.5)

## 2016-10-31 LAB — COMPREHENSIVE METABOLIC PANEL
ALT: 10 U/L — ABNORMAL LOW (ref 17–63)
AST: 15 U/L (ref 15–41)
Albumin: 3.4 g/dL — ABNORMAL LOW (ref 3.5–5.0)
Alkaline Phosphatase: 89 U/L (ref 38–126)
Anion gap: 6 (ref 5–15)
BILIRUBIN TOTAL: 0.4 mg/dL (ref 0.3–1.2)
BUN: 19 mg/dL (ref 6–20)
CO2: 28 mmol/L (ref 22–32)
Calcium: 8.9 mg/dL (ref 8.9–10.3)
Chloride: 105 mmol/L (ref 101–111)
Creatinine, Ser: 0.93 mg/dL (ref 0.61–1.24)
GFR calc Af Amer: >60 mL/min (ref >60)
GFR calc non Af Amer: >60 mL/min (ref >60)
GLUCOSE: 75 mg/dL (ref 65–99)
POTASSIUM: 3.9 mmol/L (ref 3.5–5.1)
Sodium: 139 mmol/L (ref 135–145)
TOTAL PROTEIN: 7.6 g/dL (ref 6.5–8.1)

## 2016-10-31 LAB — TSH: TSH: 2.702 u[IU]/mL (ref 0.350–4.500)

## 2016-10-31 MED ORDER — HYDROCODONE-HOMATROPINE 5-1.5 MG/5ML PO SYRP: 5.0000 mL | ORAL_SOLUTION | Freq: Four times a day (QID) | ORAL | 0 refills | Status: AC | PRN

## 2016-10-31 MED ORDER — SODIUM CHLORIDE 0.9 % IV SOLN
Freq: Once | INTRAVENOUS | Status: AC
  Administered 2016-10-31: 13:00:00 via INTRAVENOUS

## 2016-10-31 MED ORDER — HEPARIN SOD (PORK) LOCK FLUSH 100 UNIT/ML IV SOLN
INTRAVENOUS | Status: AC
  Filled 2016-10-31: qty 5

## 2016-10-31 MED ORDER — HEPARIN SOD (PORK) LOCK FLUSH 100 UNIT/ML IV SOLN
500.0000 [IU] | Freq: Once | INTRAVENOUS | Status: AC | PRN
  Administered 2016-10-31: 500 [IU]

## 2016-10-31 MED ORDER — SODIUM CHLORIDE 0.9 % IV SOLN
240.0000 mg | Freq: Once | INTRAVENOUS | Status: AC
  Administered 2016-10-31: 240 mg via INTRAVENOUS
  Filled 2016-10-31: qty 24

## 2016-10-31 MED ORDER — SODIUM CHLORIDE 0.9% FLUSH
10.0000 mL | INTRAVENOUS | Status: DC | PRN
  Administered 2016-10-31: 10 mL
  Filled 2016-10-31: qty 10

## 2016-10-31 MED ORDER — OMEPRAZOLE 20 MG PO CPDR: 20.0000 mg | DELAYED_RELEASE_CAPSULE | Freq: Every day | ORAL | 0 refills | Status: AC

## 2016-10-31 NOTE — Progress Notes (Signed)
[facility] Hematology/Oncology Progress Note  Name: Shaun Fisher      MRN: [id number]    Location: Room/bed info not found  Date: 10/02/2016 Time:2:14 PM   REFERRING PHYSICIAN:  Evalee Mutton, MD (Medical Oncology at [facility] [place])  REASON FOR CONSULT:  Metastatic non-small cell lung cancer   DIAGNOSIS:  Stage IV Metastatic non-small cell lung cancer  HISTORY OF PRESENT ILLNESS:   Shaun Fisher is a 81 y.o. 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  Started immunotherapy on 08/08/2016 with Nivolumab.        Non-small cell lung cancer ([place])   03/24/2015 Procedure    Needle biopsy of left upper lobe lesion.      03/24/2015 Pathology Results    Well differentiated adenocarcinoma.      03/11/2016 Pathology Results    ROS1 negative.  EGFR negative. PDL1 0%.  ALK negative.      05/30/2016 - 07/11/2016 Chemotherapy    [facility]- Carboplatin/Paclitaxel/Avastin every 3 weeks, x 3 cycles      07/30/2016 Progression    CT imaging demonstrated progression of disease.  Lung nodules increased in size in a couple of new nodules as well.  His primary lingular mass has increased in size as well.  His abdomen/pelvis was negative for malignancy.      07/30/2016 Imaging    [facility]- CT CAP-no significant change in dominant mass within the lingula measuring 3.6 x 2.1 cm.  Increase in size and number of multiple pulmonary nodules bilaterally compatible with progression.  No CT evidence of metastatic  disease within the abdomen or pelvis.      08/08/2016 -  Chemotherapy    [facility]- Opdivo every 2 weeks (approximate start date)        Oncology history reviewed with the patient.  He was initially diagnosed with adenocarcinoma of left lung on biopsy in 2016.  The patient decided to pursue alternative treatment in [place].  He reports that it was an "immune-booster treatment(s)."  Of course, this unfortunately failed and therefore he was started on traditional systemic chemotherapy for adenocarcinoma histology consisting of Carboplatin/Alimta/Avastin x 3 cycles.  Response evaluation with imaging performed following 3 cycles of chemotherapy demonstrated progression of disease.  He was therefore started on second-line treatment with Opdivo in jan 2018.  His tumor was ALK, EGFR, ROS1, and PDL1 NEGATIVE.   Shaun Fisher presents to the clinic today for  cycle 7 of Nivolumab. I personally reviewed and went over labs with the patient.  He is tolerating opdivo well without side effects.      Denies chest pain, sob, nausea, and, abdominal pain.  He notes he has 3-4 bowel movements a day which is normal for him since he had his partial colectomy, no increase in diarrhea since starting opdivo.    He notes he has trouble going to sleep at night. He notes that when he lays down his throat feels clogged up and he ends up coughing. I recommended he sleep with his head elevated at night to resolve this.   He requested a refill for his Prilosec  and Hycodan.   Review of Systems  Constitutional: Negative.   HENT: Negative.   Eyes: Negative.   Respiratory: Positive for cough (at night while laying in bed). Negative for shortness of breath.   Cardiovascular: Negative.  Negative for chest pain.  Gastrointestinal: Positive for diarrhea (mild, chronic). Negative for abdominal pain and nausea.  Genitourinary: Negative.   Musculoskeletal: Negative.   Skin: Negative.   Neurological: Negative.     Endo/Heme/Allergies: Negative.   Psychiatric/Behavioral: Negative.        Trouble sleeping due to feeling his throat is clogged up and coughing.   All other systems reviewed and are negative.     PAST MEDICAL HISTORY:       Past Medical History:  Diagnosis Date  . Black lung disease ([place]) 10/02/2016  . BPH (benign prostatic hyperplasia) 10/02/2016  . COPD (chronic obstructive pulmonary disease) ([place])   . Emphysema of lung ([place])   . GERD (gastroesophageal reflux disease)   . Non-small cell lung cancer ([place]) 10/02/2016  . Substance abuse    tobacco    ALLERGIES: Not on File    MEDICATIONS: I have reviewed the patient's current medications.    No current outpatient prescriptions on file prior to visit.   No current facility-administered medications on file prior to visit.      PAST SURGICAL HISTORY      Past Surgical History:  Procedure Laterality Date  . PARTIAL COLECTOMY      FAMILY HISTORY: Mother deceased at the age of 44 secondary to heart disease. Father deceased at age 25 secondary to "old age." He has 4 brothers and 2 sisters. He has 2 children, one son and one daughter.  SOCIAL HISTORY:  reports that he has been smoking Cigarettes.  He has a 30.00 pack-year smoking history. He has never used smokeless tobacco. He reports that he does not drink alcohol or use drugs.  He has [place] religion.  He is retired but used to work in the [employer].  He is a widower with his wife passing at the age of 56 secondary to metastatic breast cancer.  Social History        Social History  . Marital status: Widowed    Spouse name: N/A  . Number of children: N/A  . Years of education: N/A        Social History Main Topics  . Smoking status: Current Every Day Smoker    Packs/day: 0.50    Years: 60.00    Types: Cigarettes  . Smokeless tobacco: Never Used  . Alcohol use No  . Drug use: No  . Sexual activity: Not Asked       Other Topics  Concern  . None      Social History Narrative  . None    PERFORMANCE STATUS: The patient's performance status is 1 - Symptomatic but completely ambulatory  PHYSICAL EXAM    Physical Exam  Constitutional: He is oriented to person, place, and time and well-developed, well-nourished, and in no distress.  HENT:  Head: Normocephalic and atraumatic.  Eyes: Conjunctivae and EOM are normal. Pupils are equal, round, and reactive to light.  Neck: Normal range of motion. Neck supple.  Cardiovascular: Normal rate, regular rhythm and normal heart sounds.   Pulmonary/Chest: Effort normal and breath sounds normal.  Abdominal: Soft. Bowel sounds are normal.  Musculoskeletal: Normal range of motion.  Neurological: He is alert and oriented to person, place, and time. Gait normal.  Skin: Skin is warm and  dry.  Nursing note and vitals reviewed.   LABORATORY DATA:  Results for TAVAUGHN, SILGUERO (MRN [id number]) as of 10/31/2016 10:32  Ref. Range 10/30/2016 08:51  WBC Latest Ref Range: 4.0 - 10.5 K/uL 7.1  RBC Latest Ref Range: 4.22 - 5.81 MIL/uL 4.72  Hemoglobin Latest Ref Range: 13.0 - 17.0 g/dL 13.5  HCT Latest Ref Range: 39.0 - 52.0 % 41.4  MCV Latest Ref Range: 78.0 - 100.0 fL 87.7  MCH Latest Ref Range: 26.0 - 34.0 pg 28.6  MCHC Latest Ref Range: 30.0 - 36.0 g/dL 32.6  RDW Latest Ref Range: 11.5 - 15.5 % 14.5  Platelets Latest Ref Range: 150 - 400 K/uL 155  Neutrophils Latest Units: % 53  Lymphocytes Latest Units: % 32  Monocytes Relative Latest Units: % 8  Eosinophil Latest Units: % 6  Basophil Latest Units: % 1  NEUT# Latest Ref Range: 1.7 - 7.7 K/uL 3.8  Lymphocyte # Latest Ref Range: 0.7 - 4.0 K/uL 2.2  Monocyte # Latest Ref Range: 0.1 - 1.0 K/uL 0.6  Eosinophils Absolute Latest Ref Range: 0.0 - 0.7 K/uL 0.4  Basophils Absolute Latest Ref Range: 0.0 - 0.1 K/uL 0.0   Results for KEIGAN, TAFOYA (MRN [id number]) as of 10/31/2016 10:32  Ref. Range 10/30/2016 08:49  Sodium Latest Ref  Range: 135 - 145 mmol/L 139  Potassium Latest Ref Range: 3.5 - 5.1 mmol/L 3.2 (L)  Chloride Latest Ref Range: 101 - 111 mmol/L 104  CO2 Latest Ref Range: 22 - 32 mmol/L 25  Glucose Latest Ref Range: 65 - 99 mg/dL 109 (H)  BUN Latest Ref Range: 6 - 20 mg/dL 22 (H)  Creatinine Latest Ref Range: 0.61 - 1.24 mg/dL 1.02  Calcium Latest Ref Range: 8.9 - 10.3 mg/dL 9.1  Anion gap Latest Ref Range: 5 - 15  10  Alkaline Phosphatase Latest Ref Range: 38 - 126 U/L 92  Albumin Latest Ref Range: 3.5 - 5.0 g/dL 3.3 (L)  AST Latest Ref Range: 15 - 41 U/L 19  ALT Latest Ref Range: 17 - 63 U/L 11 (L)  Total Protein Latest Ref Range: 6.5 - 8.1 g/dL 7.5  Total Bilirubin Latest Ref Range: 0.3 - 1.2 mg/dL 0.2 (L)  EGFR (African American) Latest Ref Range: >60 mL/min >60  EGFR (Non-African Amer.) Latest Ref Range: >60 mL/min >60   Results for TOUSSAINT, GOLSON (MRN [id number]) as of 10/31/2016 10:32  Ref. Range 10/02/2016 13:11 10/16/2016 09:10 10/30/2016 08:50  TSH Latest Ref Range: 0.350 - 4.500 uIU/mL 3.129 3.242 2.991    RADIOGRAPHY:           PATHOLOGY:              PAST VIA PATHWAY TREATMENT AT [facility]:            ASSESSMENT/PLAN:   Non-small cell lung cancer ([place]) Stage IV, metastatic NSCLC, adenocarcinoma (T2AN0M1A) of left lung with disease in the contralateral lung, S/P Carboplatin/Paclitaxel/Avastin x 3 cycles (05/30/16- 07/11/2016) with documented progression of disease of CT imaging without disease identified inferior to the diaphragm.  Started immunotherapy on 08/08/2016 with Nivolumab.   Labs reviewed. Results noted above.   He is tolerating his opdivo well. Continue treatment as planned.   I refilled his Prilosec and Hycodan.   RTC in 2 weeks. Patient will be returning to [facility] on May 8th.   All questions were answered. The patient knows to call the clinic with any problems, questions or concerns. We can certainly see the patient much  sooner  if necessary.  This document serves as a record of services personally performed by Twana First, MD. It was created on her behalf by Shirlean Mylar, a trained medical scribe. The creation of this record is based on the scribe's personal observations and the provider's statements to them. This document has been checked and approved by the attending provider.  I have reviewed the above documentation for accuracy and completeness and I agree with the above.

## 2016-10-31 NOTE — Progress Notes (Signed)
Chemotherapy given today per orders. Patient tolerated it well, no issues. Vitals stable and discharged home from clinic ambulatory. Follow up as scheduled.

## 2016-10-31 NOTE — Patient Instructions (Signed)
[facility] Discharge Instructions for Patients Receiving Chemotherapy   Beginning January 23rd 2017 lab work for the Cancer Center will be done in the  Main lab at [place] on 1st floor. If you have a lab appointment with the Cancer Center please come in thru the  Main Entrance and check in at the main information desk   Today you received the following chemotherapy agents   To help prevent nausea and vomiting after your treatment, we encourage you to take your nausea medication     If you develop nausea and vomiting, or diarrhea that is not controlled by your medication, call the clinic.  The clinic phone number is [phone]. Office hours are Monday-Friday 8:30am-5:00pm.  BELOW ARE SYMPTOMS THAT SHOULD BE REPORTED IMMEDIATELY:  *FEVER GREATER THAN 101.0 F  *CHILLS WITH OR WITHOUT FEVER  NAUSEA AND VOMITING THAT IS NOT CONTROLLED WITH YOUR NAUSEA MEDICATION  *UNUSUAL SHORTNESS OF BREATH  *UNUSUAL BRUISING OR BLEEDING  TENDERNESS IN MOUTH AND THROAT WITH OR WITHOUT PRESENCE OF ULCERS  *URINARY PROBLEMS  *BOWEL PROBLEMS  UNUSUAL RASH Items with * indicate a potential emergency and should be followed up as soon as possible. If you have an emergency after office hours please contact your primary care physician or go to the nearest emergency department.  Please call the clinic during office hours if you have any questions or concerns.   You may also contact the Patient Navigator at [phone] should you have any questions or need assistance in obtaining follow up care.      Resources For Cancer Patients and their Caregivers ? American Cancer Society: Can assist with transportation, wigs, general needs, runs Look Good Feel Better.        1-888-227-6333 ? Cancer Care: Provides financial assistance, online support groups, medication/co-pay assistance.  1-800-813-HOPE (4673) ? [facility] Assists [place] cancer  patients and their families through emotional , educational and financial support.  336-427-4357 ? [place] DSS Where to apply for food stamps, Medicaid and utility assistance. 336-342-1394 ? RCATS: Transportation to medical appointments. 336-347-2287 ? Social Security Administration: May apply for disability if have a Stage IV cancer. [phone] 1-800-772-1213 ? [place] Aging, Disability and Transit Services: Assists with nutrition, care and transit needs. [phone]         

## 2016-10-31 NOTE — Patient Instructions (Signed)
[place] at [facility] Discharge Instructions  RECOMMENDATIONS MADE BY THE CONSULTANT AND ANY TEST RESULTS WILL BE SENT TO YOUR REFERRING PHYSICIAN.  You were seen today by Dr. Twana First Follow up in 2 weeks See Amy up front for appointments   Thank you for choosing [place] at [facility] to provide your oncology and hematology care.  To afford each patient quality time with our provider, please arrive at least 15 minutes before your scheduled appointment time.    If you have a lab appointment with the [facility] please come in thru the  Main Entrance and check in at the main information desk  You need to re-schedule your appointment should you arrive 10 or more minutes late.  We strive to give you quality time with our providers, and arriving late affects you and other patients whose appointments are after yours.  Also, if you no show three or more times for appointments you may be dismissed from the clinic at the providers discretion.     Again, thank you for choosing [facility].  Our hope is that these requests will decrease the amount of time that you wait before being seen by our physicians.       _____________________________________________________________  Should you have questions after your visit to [facility], please contact our office at [phone] between the hours of 8:30 a.m. and 4:30 p.m.  Voicemails left after 4:30 p.m. will not be returned until the following business day.  For prescription refill requests, have your pharmacy contact our office.       Resources For Cancer Patients and their Caregivers ? American Cancer Society: Can assist with transportation, wigs, general needs, runs Look Good Feel Better.        [phone] ? Cancer Care: Provides financial assistance, online support groups, medication/co-pay assistance.  1-800-813-HOPE (707) 334-8522) ? [facility] Assists [place] cancer patients and their families through emotional , educational and financial support.  681-853-3977 ? [place] DSS Where to apply for food stamps, Medicaid and utility assistance. ([phone] ? RCATS: Transportation to medical appointments. 365-015-0033 ? Social Security Administration: May apply for disability if have a Stage IV cancer. 670-454-5538 [phone] ? [employer], Disability and Transit Services: Assists with nutrition, care and transit needs. [place] Support Programs: '@10RELATIVEDAYS'$ @ > Cancer Support Group  2nd Tuesday of the month 1pm-2pm, Journey Room  > Creative Journey  3rd Tuesday of the month 1130am-1pm, Journey Room  > Look Good Feel Better  1st Wednesday of the month 10am-12 noon, Journey Room (Call [place] to register [phone])

## 2016-11-13 ENCOUNTER — Encounter (HOSPITAL_COMMUNITY): Payer: Federal, State, Local not specified - PPO

## 2016-11-13 DIAGNOSIS — C3492 Malignant neoplasm of unspecified part of left bronchus or lung: Secondary | ICD-10-CM

## 2016-11-13 LAB — CBC WITH DIFFERENTIAL/PLATELET
BASOS ABS: 0 10*3/uL (ref 0.0–0.1)
Basophils Relative: 0 %
EOS ABS: 0.5 10*3/uL (ref 0.0–0.7)
EOS PCT: 6 %
HCT: 44.1 % (ref 39.0–52.0)
Hemoglobin: 14.6 g/dL (ref 13.0–17.0)
LYMPHS PCT: 34 %
Lymphs Abs: 2.7 10*3/uL (ref 0.7–4.0)
MCH: 28.9 pg (ref 26.0–34.0)
MCHC: 33.1 g/dL (ref 30.0–36.0)
MCV: 87.3 fL (ref 78.0–100.0)
Monocytes Absolute: 0.8 10*3/uL (ref 0.1–1.0)
Monocytes Relative: 10 %
Neutro Abs: 4 10*3/uL (ref 1.7–7.7)
Neutrophils Relative %: 50 %
PLATELETS: 180 10*3/uL (ref 150–400)
RBC: 5.05 MIL/uL (ref 4.22–5.81)
RDW: 14.5 % (ref 11.5–15.5)
WBC: 8 10*3/uL (ref 4.0–10.5)

## 2016-11-13 LAB — COMPREHENSIVE METABOLIC PANEL
ALK PHOS: 108 U/L (ref 38–126)
ALT: 12 U/L — AB (ref 17–63)
AST: 18 U/L (ref 15–41)
Albumin: 3.7 g/dL (ref 3.5–5.0)
Anion gap: 7 (ref 5–15)
BUN: 13 mg/dL (ref 6–20)
CALCIUM: 9.2 mg/dL (ref 8.9–10.3)
CHLORIDE: 100 mmol/L — AB (ref 101–111)
CO2: 29 mmol/L (ref 22–32)
Creatinine, Ser: 0.98 mg/dL (ref 0.61–1.24)
GFR calc Af Amer: >60 mL/min (ref >60)
GFR calc non Af Amer: >60 mL/min (ref >60)
Glucose, Bld: 79 mg/dL (ref 65–99)
Potassium: 3.8 mmol/L (ref 3.5–5.1)
SODIUM: 136 mmol/L (ref 135–145)
Total Bilirubin: 0.5 mg/dL (ref 0.3–1.2)
Total Protein: 8.2 g/dL — ABNORMAL HIGH (ref 6.5–8.1)

## 2016-11-13 LAB — TSH: TSH: 4.236 u[IU]/mL (ref 0.350–4.500)

## 2016-11-14 ENCOUNTER — Encounter (HOSPITAL_BASED_OUTPATIENT_CLINIC_OR_DEPARTMENT_OTHER): Payer: Federal, State, Local not specified - PPO | Admitting: Oncology

## 2016-11-14 ENCOUNTER — Encounter (HOSPITAL_BASED_OUTPATIENT_CLINIC_OR_DEPARTMENT_OTHER): Payer: Federal, State, Local not specified - PPO

## 2016-11-14 ENCOUNTER — Encounter (HOSPITAL_COMMUNITY): Payer: Self-pay | Admitting: Oncology

## 2016-11-14 VITALS — BP 100/35 | HR 79 | Temp 97.3°F | Resp 16 | Wt 136.8 lb

## 2016-11-14 DIAGNOSIS — R05 Cough: Secondary | ICD-10-CM | POA: Diagnosis not present

## 2016-11-14 DIAGNOSIS — C3492 Malignant neoplasm of unspecified part of left bronchus or lung: Secondary | ICD-10-CM

## 2016-11-14 DIAGNOSIS — K529 Noninfective gastroenteritis and colitis, unspecified: Secondary | ICD-10-CM | POA: Diagnosis not present

## 2016-11-14 DIAGNOSIS — G620 Drug-induced polyneuropathy: Secondary | ICD-10-CM | POA: Diagnosis not present

## 2016-11-14 DIAGNOSIS — Z5112 Encounter for antineoplastic immunotherapy: Secondary | ICD-10-CM | POA: Diagnosis not present

## 2016-11-14 DIAGNOSIS — Z720 Tobacco use: Secondary | ICD-10-CM

## 2016-11-14 LAB — CBC WITH DIFFERENTIAL/PLATELET
BASOS PCT: 1 %
Basophils Absolute: 0.1 10*3/uL (ref 0.0–0.1)
EOS ABS: 0.4 10*3/uL (ref 0.0–0.7)
EOS PCT: 6 %
HCT: 42.1 % (ref 39.0–52.0)
HEMOGLOBIN: 14 g/dL (ref 13.0–17.0)
LYMPHS ABS: 2.5 10*3/uL (ref 0.7–4.0)
LYMPHS PCT: 33 %
MCH: 29 pg (ref 26.0–34.0)
MCHC: 33.3 g/dL (ref 30.0–36.0)
MCV: 87.3 fL (ref 78.0–100.0)
MONO ABS: 0.6 10*3/uL (ref 0.1–1.0)
Monocytes Relative: 8 %
NEUTROS ABS: 3.9 10*3/uL (ref 1.7–7.7)
Neutrophils Relative %: 52 %
PLATELETS: 178 10*3/uL (ref 150–400)
RBC: 4.82 MIL/uL (ref 4.22–5.81)
RDW: 14.6 % (ref 11.5–15.5)
WBC: 7.4 10*3/uL (ref 4.0–10.5)

## 2016-11-14 LAB — COMPREHENSIVE METABOLIC PANEL
ALBUMIN: 3.7 g/dL (ref 3.5–5.0)
ALT: 12 U/L — ABNORMAL LOW (ref 17–63)
AST: 20 U/L (ref 15–41)
Alkaline Phosphatase: 105 U/L (ref 38–126)
Anion gap: 9 (ref 5–15)
BILIRUBIN TOTAL: 0.3 mg/dL (ref 0.3–1.2)
BUN: 17 mg/dL (ref 6–20)
CHLORIDE: 106 mmol/L (ref 101–111)
CO2: 24 mmol/L (ref 22–32)
Calcium: 9.1 mg/dL (ref 8.9–10.3)
Creatinine, Ser: 0.98 mg/dL (ref 0.61–1.24)
GFR calc Af Amer: >60 mL/min (ref >60)
GFR calc non Af Amer: >60 mL/min (ref >60)
GLUCOSE: 69 mg/dL (ref 65–99)
POTASSIUM: 4 mmol/L (ref 3.5–5.1)
SODIUM: 139 mmol/L (ref 135–145)
Total Protein: 8.2 g/dL — ABNORMAL HIGH (ref 6.5–8.1)

## 2016-11-14 LAB — TSH: TSH: 3.27 u[IU]/mL (ref 0.350–4.500)

## 2016-11-14 MED ORDER — HEPARIN SOD (PORK) LOCK FLUSH 100 UNIT/ML IV SOLN
500.0000 [IU] | Freq: Once | INTRAVENOUS | Status: AC | PRN
  Administered 2016-11-14: 500 [IU]

## 2016-11-14 MED ORDER — SODIUM CHLORIDE 0.9 % IV SOLN
240.0000 mg | Freq: Once | INTRAVENOUS | Status: AC
  Administered 2016-11-14: 240 mg via INTRAVENOUS
  Filled 2016-11-14: qty 24

## 2016-11-14 MED ORDER — SODIUM CHLORIDE 0.9 % IV SOLN
Freq: Once | INTRAVENOUS | Status: AC
  Administered 2016-11-14: 14:00:00 via INTRAVENOUS

## 2016-11-14 MED ORDER — SODIUM CHLORIDE 0.9% FLUSH: 10.0000 mL | INTRAVENOUS | Status: DC | PRN

## 2016-11-14 MED ORDER — HEPARIN SOD (PORK) LOCK FLUSH 100 UNIT/ML IV SOLN
INTRAVENOUS | Status: AC
  Filled 2016-11-14: qty 5

## 2016-11-14 NOTE — Assessment & Plan Note (Addendum)
Stage IV, metastatic NSCLC, adenocarcinoma (T2AN0M1A) of left lung with disease in the contralateral lung, S/P Carboplatin/Paclitaxel/Avastin x 3 cycles (05/30/16- 07/11/2016) with documented progression of disease of CT imaging without disease identified inferior to the diaphragm.  Started immunotherapy on 08/08/2016 with Nivolumab. 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 AND Other chronic medical issues including:BPH, GERD, COPD/Emphysema, black lung from coal mining, H/O partial colectomy from colitis   Pre-treatment labs on 11/13/2016: CBC diff, CMET, TSH.  I personally reviewed and went over laboratory results with the patient.  The results are noted within this dictation.  Labs satisfy treatment parameters today.  He is leaving on 11/24/2016 to return to [place].  He will return to Dr. Oliva Bustard (primary medical oncology at [facility]) as scheduled on 12/02/2016.  Restaging tests will be performed in [place] (unless otherwise indicated).  He anticipates returning to [place] in September 2018.  We will be happy to coordinate ongoing oncology treatment if/when he returns back to [place] for his next extended stay with family.  Problem list reviewed with patient and edited accordingly.  Medications are reviewed with the patient and edited accordingly.  No return appointment given his return to [place] next week.  More than 50% of the time spent with the patient was utilized for counseling and coordination of care.  Will fax copy of today's dictation to Dr. Evalee Mutton at [facility].  Fax#: [phone]

## 2016-11-14 NOTE — Progress Notes (Signed)
Labs reviewed, proceed with treatment.  Chemotherapy given today per orders. Vitals stable and discharged home from clinic ambulatory. Follow up as scheduled.

## 2016-11-14 NOTE — Progress Notes (Signed)
[place], DO [address] [place] [address]  Non-small cell cancer of left lung [facility])  CURRENT THERAPY: Nivolumab beginning on 08/08/2016 at [facility] under the care of Evalee Mutton, MD (Medical Oncology at [facility], [place])  INTERVAL HISTORY: Shaun Fisher 81 y.o. male returns for followup of Stage IV, metastatic NSCLC, adenocarcinoma (T2AN0M1A) of left lung with disease in the contralateral lung, S/P Carboplatin/Paclitaxel/Avastin x 3 cycles (05/30/16- 07/11/2016) with documented progression of disease of CT imaging without disease identified inferior to the diaphragm.  Started immunotherapy on 08/08/2016 with Nivolumab.    Non-small cell lung cancer ([place])   03/24/2015 Procedure    Needle biopsy of left upper lobe lesion.      03/24/2015 Pathology Results    Well differentiated adenocarcinoma.      03/11/2016 Pathology Results    ROS1 negative.  EGFR negative. PDL1 0%.  ALK negative.      05/30/2016 - 07/11/2016 Chemotherapy    [facility]- Carboplatin/Paclitaxel/Avastin every 3 weeks, x 3 cycles      07/30/2016 Progression    CT imaging demonstrated progression of disease.  Lung nodules increased in size in a couple of new nodules as well.  His primary lingular mass has increased in size as well.  His abdomen/pelvis was negative for malignancy.      07/30/2016 Imaging    [facility]- CT CAP-no significant change in dominant mass within the lingula measuring 3.6 x 2.1 cm.  Increase in size and number of multiple pulmonary nodules bilaterally compatible with progression.  No CT evidence of metastatic disease within the abdomen or pelvis.      08/08/2016 -  Chemotherapy    [facility]- Opdivo every 2 weeks (approximate start date)        He is tolerating treatment well.  He does not have any of the side effects associated with immunotherapy.  He does have chronic diarrhea secondary to a history of colitis requiring resection of bowel.  He denies any change in his bowel habits and  reports that his loose stools are at baseline.  He denies any change in frequency as well.  He does have a chronic cough that is productive of yellow/green sputum.  He notes that this is stable and chronic as well.  He denies any hemoptysis.  He denies any change in shortness of breath or dyspnea on exertion.  These are at baseline.  He denies any hemoptysis.  He denies any fevers or chills.  He denies any nasal discharge or congestion.  He denies any neurologic complaints including headaches or dizziness.  He denies any focal weakness.  Appetite is stable.  Weight is stable.  He denies any the following side effects associated with immunotherapy: Nivolimab side effects:  Lung problems (pneumonitis):   Shortness of breath   Chest pain    New or worse cough  Intestinal problems (colitis):   Diarrhea or more bowel movements than usual   Stools that are black, tarry, sticky, or have bloodwork mucous   Severe stomach (abdominal) pain or tenderness  Liver problems (hepatitis):   Yellowing of skin or the sclera of eyes   Nausea or vomiting   Pain on the right side of abdomen   Dark urine   Feeling less hungry than usual   Bleeding or bruising more easily than normal  Hormone gland problems (thyroid, pituitary, adrenal glands, and pancreas):   Rapid heartbeat   Weight loss or weight gain   Increased  sweating   Feeling more hungry or thirsty   Urinating more than usual   Hair loss   Feeling cold   Constipation   Voice changes (voice getting deeper)   Muscle aches   Dizziness or fainting   Headaches that will not go away, or unusual headaches  Kidney problems (nephritis and kidney failure):   Change in amount of urine or color of urine   Problems and other organs:   Rash   Change in eyesight   Severe or persistent muscle or joint pains   Severe muscle weakness  Infusion (IV) reactions:   Chills or shaking   Shortness of breath or wheezing   Itching or  rash   Flushing   Dizziness   Fevers   Feeling like passing out   Review of Systems  Constitutional: Positive for malaise/fatigue (at baseline). Negative for chills, fever and weight loss.  HENT: Negative.   Eyes: Negative.   Respiratory: Positive for cough (at baseline), sputum production (at baseline) and shortness of breath (at baseline). Negative for hemoptysis.   Cardiovascular: Negative.  Negative for chest pain.  Gastrointestinal: Negative.  Negative for blood in stool, constipation, diarrhea, melena, nausea and vomiting.  Genitourinary: Negative.   Musculoskeletal: Negative.   Skin: Negative.  Negative for itching and rash.  Neurological: Negative.  Negative for weakness.  Endo/Heme/Allergies: Negative.   Psychiatric/Behavioral: Negative.     Past Medical History:  Diagnosis Date  . Black lung disease ([place]) 10/02/2016  . BPH (benign prostatic hyperplasia) 10/02/2016  . COPD (chronic obstructive pulmonary disease) ([place])   . Emphysema of lung ([place])   . GERD (gastroesophageal reflux disease)   . Non-small cell lung cancer ([place]) 10/02/2016  . Substance abuse    tobacco    Past Surgical History:  Procedure Laterality Date  . PARTIAL COLECTOMY      History reviewed. No pertinent family history.  Social History   Social History  . Marital status: Widowed    Spouse name: N/A  . Number of children: N/A  . Years of education: N/A   Social History Main Topics  . Smoking status: Current Every Day Smoker    Packs/day: 0.50    Years: 60.00    Types: Cigarettes  . Smokeless tobacco: Never Used  . Alcohol use No  . Drug use: No  . Sexual activity: Not Asked   Other Topics Concern  . None   Social History Narrative  . None     PHYSICAL EXAMINATION  ECOG PERFORMANCE STATUS: 1 - Symptomatic but completely ambulatory  There were no vitals filed for this visit.  Vitals - 1 value per visit 03/30/4096  SYSTOLIC 353  DIASTOLIC 52  Pulse 85  Temperature 98   Respirations 18  Weight (lb) 136.8    GENERAL:alert, no distress, well nourished, well developed, comfortable, cooperative and in chemo-recliner, unaccompanied. SKIN: skin color, texture, turgor are normal, no rashes or significant lesions HEAD: Normocephalic, No masses, lesions, tenderness or abnormalities EYES: normal, EOMI, Conjunctiva are pink and non-injected EARS: External ears normal OROPHARYNX:lips, buccal mucosa, and tongue normal and mucous membranes are moist  NECK: supple, trachea midline LYMPH:  no palpable lymphadenopathy BREAST:not examined LUNGS: clear to auscultation , decreased breath sounds throughout bilaterally. HEART: regular rate & rhythm, no murmurs and no gallops ABDOMEN:abdomen soft, non-tender and normal bowel sounds BACK: Back symmetric, no curvature. EXTREMITIES:less then 2 second capillary refill, no joint deformities, effusion, or inflammation, no skin discoloration, no cyanosis  NEURO: alert & oriented  x 3 with fluent speech, no focal motor/sensory deficits, gait normal   LABORATORY DATA: CBC    Component Value Date/Time   WBC 7.4 11/14/2016 1256   RBC 4.82 11/14/2016 1256   HGB 14.0 11/14/2016 1256   HCT 42.1 11/14/2016 1256   PLT 178 11/14/2016 1256   MCV 87.3 11/14/2016 1256   MCH 29.0 11/14/2016 1256   MCHC 33.3 11/14/2016 1256   RDW 14.6 11/14/2016 1256   LYMPHSABS 2.5 11/14/2016 1256   MONOABS 0.6 11/14/2016 1256   EOSABS 0.4 11/14/2016 1256   BASOSABS 0.1 11/14/2016 1256      Chemistry      Component Value Date/Time   NA 136 11/13/2016 0908   K 3.8 11/13/2016 0908   CL 100 (L) 11/13/2016 0908   CO2 29 11/13/2016 0908   BUN 13 11/13/2016 0908   CREATININE 0.98 11/13/2016 0908      Component Value Date/Time   CALCIUM 9.2 11/13/2016 0908   ALKPHOS 108 11/13/2016 0908   AST 18 11/13/2016 0908   ALT 12 (L) 11/13/2016 0908   BILITOT 0.5 11/13/2016 0908     Lab Results  Component Value Date   TSH 4.236 11/13/2016     PENDING LABS:   RADIOGRAPHIC STUDIES:  No results found.   PATHOLOGY:    ASSESSMENT AND PLAN:  Non-small cell lung cancer ([place]) Stage IV, metastatic NSCLC, adenocarcinoma (T2AN0M1A) of left lung with disease in the contralateral lung, S/P Carboplatin/Paclitaxel/Avastin x 3 cycles (05/30/16- 07/11/2016) with documented progression of disease of CT imaging without disease identified inferior to the diaphragm.  Started immunotherapy on 08/08/2016 with Nivolumab. 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 AND Other chronic medical issues including:BPH, GERD, COPD/Emphysema, black lung from coal mining, H/O partial colectomy from colitis   Pre-treatment labs on 11/13/2016: CBC diff, CMET, TSH.  I personally reviewed and went over laboratory results with the patient.  The results are noted within this dictation.  Labs satisfy treatment parameters today.  He is leaving on 11/24/2016 to return to [place].  He will return to Dr. Oliva Bustard (primary medical oncology at [facility]) as scheduled on 12/02/2016.  Restaging tests will be performed in [place] (unless otherwise indicated).  He anticipates returning to [place] in September 2018.  We will be happy to coordinate ongoing oncology treatment if/when he returns back to [place] for his next extended stay with family.  Problem list reviewed with patient and edited accordingly.  Medications are reviewed with the patient and edited accordingly.  No return appointment given his return to [place] next week.  More than 50% of the time spent with the patient was utilized for counseling and coordination of care.  Will fax copy of today's dictation to Dr. Evalee Mutton at [facility].  Fax#: [phone]    ORDERS PLACED FOR THIS ENCOUNTER: No orders of the defined types were placed in this encounter.   MEDICATIONS PRESCRIBED THIS  ENCOUNTER: Meds ordered this encounter  Medications  . triamcinolone cream (KENALOG) 0.5 %    Sig: APPLY TO AFFECTED AREA 2 TIMES A DAY    Refill:  0    THERAPY PLAN:  Nivolumab every 2 weeks until progression of disease.   All questions were answered. The patient knows to call the clinic with any problems, questions or concerns. We can certainly see the patient much sooner if necessary.  Patient and plan discussed with Dr. Twana First and she is  in agreement with the aforementioned.   This note is electronically signed by: Doy Mince 11/14/2016 1:29 PM

## 2016-11-14 NOTE — Patient Instructions (Signed)
[facility] Discharge Instructions for Patients Receiving Chemotherapy   Beginning January 23rd 2017 lab work for the Cancer Center will be done in the  Main lab at [facility] on 1st floor. If you have a lab appointment with the Cancer Center please come in thru the  Main Entrance and check in at the main information desk   Today you received the following chemotherapy agents   To help prevent nausea and vomiting after your treatment, we encourage you to take your nausea medication     If you develop nausea and vomiting, or diarrhea that is not controlled by your medication, call the clinic.  The clinic phone number is [phone]. Office hours are Monday-Friday 8:30am-5:00pm.  BELOW ARE SYMPTOMS THAT SHOULD BE REPORTED IMMEDIATELY:  *FEVER GREATER THAN 101.0 F  *CHILLS WITH OR WITHOUT FEVER  NAUSEA AND VOMITING THAT IS NOT CONTROLLED WITH YOUR NAUSEA MEDICATION  *UNUSUAL SHORTNESS OF BREATH  *UNUSUAL BRUISING OR BLEEDING  TENDERNESS IN MOUTH AND THROAT WITH OR WITHOUT PRESENCE OF ULCERS  *URINARY PROBLEMS  *BOWEL PROBLEMS  UNUSUAL RASH Items with * indicate a potential emergency and should be followed up as soon as possible. If you have an emergency after office hours please contact your primary care physician or go to the nearest emergency department.  Please call the clinic during office hours if you have any questions or concerns.   You may also contact the Patient Navigator at [phone] should you have any questions or need assistance in obtaining follow up care.      Resources For Cancer Patients and their Caregivers ? American Cancer Society: Can assist with transportation, wigs, general needs, runs Look Good Feel Better.        1-888-227-6333 ? Cancer Care: Provides financial assistance, online support groups, medication/co-pay assistance.  1-800-813-HOPE (4673) ? [facility] Assists [place] cancer  patients and their families through emotional , educational and financial support.  336-427-4357 ? [place] DSS Where to apply for food stamps, Medicaid and utility assistance. 336-342-1394 ? RCATS: Transportation to medical appointments. 336-347-2287 ? Social Security Administration: May apply for disability if have a Stage IV cancer. 336-342-7796 1-800-772-1213 ? [place] Aging, Disability and Transit Services: Assists with nutrition, care and transit needs. 336-349-2343         

## 2016-11-14 NOTE — Patient Instructions (Signed)
[place] at [facility] Discharge Instructions  RECOMMENDATIONS MADE BY THE CONSULTANT AND ANY TEST RESULTS WILL BE SENT TO YOUR REFERRING PHYSICIAN.  You were seen today by Kirby Crigler PA-C. Treatment today.   Thank you for choosing [place] at [facility] to provide your oncology and hematology care.  To afford each patient quality time with our provider, please arrive at least 15 minutes before your scheduled appointment time.    If you have a lab appointment with the [facility] please come in thru the  Main Entrance and check in at the main information desk  You need to re-schedule your appointment should you arrive 10 or more minutes late.  We strive to give you quality time with our providers, and arriving late affects you and other patients whose appointments are after yours.  Also, if you no show three or more times for appointments you may be dismissed from the clinic at the providers discretion.     Again, thank you for choosing [facility]  Our hope is that these requests will decrease the amount of time that you wait before being seen by our physicians.       _____________________________________________________________  Should you have questions after your visit to [facility], please contact our office at [phone] between the hours of 8:30 a.m. and 4:30 p.m.  Voicemails left after 4:30 p.m. will not be returned until the following business day.  For prescription refill requests, have your pharmacy contact our office.       Resources For Cancer Patients and their Caregivers ? American Cancer Society: Can assist with transportation, wigs, general needs, runs Look Good Feel Better.        (812)449-3713 ? Cancer Care: Provides financial assistance, online support groups, medication/co-pay assistance.  1-800-813-HOPE 615-628-2238) ? [facility] Assists [place] cancer  patients and their families through emotional , educational and financial support.  743-434-0504 ? [place] DSS Where to apply for food stamps, Medicaid and utility assistance. 813-179-3092 ? RCATS: Transportation to medical appointments. 9163520796 ? Social Security Administration: May apply for disability if have a Stage IV cancer. (307)022-5800 4421984254 ? [employer], Disability and Transit Services: Assists with nutrition, care and transit needs. [place] Support Programs: '@10RELATIVEDAYS'$ @ > Cancer Support Group  2nd Tuesday of the month 1pm-2pm, Journey Room  > Creative Journey  3rd Tuesday of the month 1130am-1pm, Journey Room  > Look Good Feel Better  1st Wednesday of the month 10am-12 noon, Journey Room (Call [place] to register [phone])

## 2016-11-26 ENCOUNTER — Other Ambulatory Visit (HOSPITAL_COMMUNITY): Payer: Self-pay | Admitting: *Deleted

## 2016-11-26 DIAGNOSIS — C3490 Malignant neoplasm of unspecified part of unspecified bronchus or lung: Secondary | ICD-10-CM

## 2016-11-27 ENCOUNTER — Other Ambulatory Visit (HOSPITAL_COMMUNITY): Payer: Federal, State, Local not specified - PPO

## 2016-11-28 ENCOUNTER — Ambulatory Visit (HOSPITAL_COMMUNITY): Payer: Federal, State, Local not specified - PPO

## 2016-12-28 ENCOUNTER — Other Ambulatory Visit (HOSPITAL_COMMUNITY): Payer: Self-pay | Admitting: Oncology

## 2016-12-28 DIAGNOSIS — E876 Hypokalemia: Secondary | ICD-10-CM

## 2017-05-30 ENCOUNTER — Encounter (HOSPITAL_COMMUNITY): Payer: Self-pay | Admitting: Emergency Medicine

## 2017-05-30 ENCOUNTER — Emergency Department (HOSPITAL_COMMUNITY): Payer: Federal, State, Local not specified - PPO

## 2017-05-30 ENCOUNTER — Emergency Department (HOSPITAL_COMMUNITY)
Admission: EM | Admit: 2017-05-30 | Discharge: 2017-05-30 | Disposition: A | Discharge status: Home / Self Care | Payer: Federal, State, Local not specified - PPO | Attending: Emergency Medicine | Admitting: Emergency Medicine

## 2017-05-30 DIAGNOSIS — Z79899 Other long term (current) drug therapy: Secondary | ICD-10-CM | POA: Diagnosis not present

## 2017-05-30 DIAGNOSIS — K0889 Other specified disorders of teeth and supporting structures: Secondary | ICD-10-CM | POA: Diagnosis present

## 2017-05-30 DIAGNOSIS — F17290 Nicotine dependence, other tobacco product, uncomplicated: Secondary | ICD-10-CM | POA: Diagnosis not present

## 2017-05-30 DIAGNOSIS — K029 Dental caries, unspecified: Secondary | ICD-10-CM | POA: Diagnosis not present

## 2017-05-30 DIAGNOSIS — J449 Chronic obstructive pulmonary disease, unspecified: Secondary | ICD-10-CM | POA: Insufficient documentation

## 2017-05-30 LAB — COMPREHENSIVE METABOLIC PANEL
ALBUMIN: 3.1 g/dL — AB (ref 3.5–5.0)
ALK PHOS: 86 U/L (ref 38–126)
ALT: 21 U/L (ref 17–63)
AST: 16 U/L (ref 15–41)
Anion gap: 7 (ref 5–15)
BUN: 21 mg/dL — ABNORMAL HIGH (ref 6–20)
CALCIUM: 8.4 mg/dL — AB (ref 8.9–10.3)
CHLORIDE: 104 mmol/L (ref 101–111)
CO2: 29 mmol/L (ref 22–32)
CREATININE: 0.73 mg/dL (ref 0.61–1.24)
GFR calc Af Amer: >60 mL/min (ref >60)
GFR calc non Af Amer: >60 mL/min (ref >60)
GLUCOSE: 97 mg/dL (ref 65–99)
Potassium: 4.3 mmol/L (ref 3.5–5.1)
SODIUM: 140 mmol/L (ref 135–145)
Total Bilirubin: 0.5 mg/dL (ref 0.3–1.2)
Total Protein: 6.3 g/dL — ABNORMAL LOW (ref 6.5–8.1)

## 2017-05-30 LAB — CBC WITH DIFFERENTIAL/PLATELET
BASOS ABS: 0 10*3/uL (ref 0.0–0.1)
BASOS PCT: 0 %
EOS ABS: 0.1 10*3/uL (ref 0.0–0.7)
Eosinophils Relative: 1 %
HCT: 38.7 % — ABNORMAL LOW (ref 39.0–52.0)
HEMOGLOBIN: 12.7 g/dL — AB (ref 13.0–17.0)
Lymphocytes Relative: 17 %
Lymphs Abs: 1.8 10*3/uL (ref 0.7–4.0)
MCH: 30.5 pg (ref 26.0–34.0)
MCHC: 32.8 g/dL (ref 30.0–36.0)
MCV: 92.8 fL (ref 78.0–100.0)
Monocytes Absolute: 0.6 10*3/uL (ref 0.1–1.0)
Monocytes Relative: 6 %
NEUTROS PCT: 76 %
Neutro Abs: 8.2 10*3/uL — ABNORMAL HIGH (ref 1.7–7.7)
Platelets: 146 10*3/uL — ABNORMAL LOW (ref 150–400)
RBC: 4.17 MIL/uL — AB (ref 4.22–5.81)
RDW: 15.8 % — ABNORMAL HIGH (ref 11.5–15.5)
WBC: 10.7 10*3/uL — AB (ref 4.0–10.5)

## 2017-05-30 MED ORDER — AMOXICILLIN 250 MG PO CAPS
500.0000 mg | ORAL_CAPSULE | Freq: Once | ORAL | Status: AC
  Administered 2017-05-30: 500 mg via ORAL
  Filled 2017-05-30: qty 2

## 2017-05-30 MED ORDER — AZITHROMYCIN 250 MG PO TABS
500.0000 mg | ORAL_TABLET | Freq: Once | ORAL | Status: AC
  Administered 2017-05-30: 500 mg via ORAL
  Filled 2017-05-30: qty 2

## 2017-05-30 MED ORDER — AZITHROMYCIN 250 MG PO TABS: 250.0000 mg | ORAL_TABLET | Freq: Every day | ORAL | 0 refills | Status: AC

## 2017-05-30 MED ORDER — AMOXICILLIN 500 MG PO CAPS: 500.0000 mg | ORAL_CAPSULE | Freq: Three times a day (TID) | ORAL | 0 refills | Status: AC

## 2017-05-30 NOTE — ED Provider Notes (Signed)
[facility] EMERGENCY DEPARTMENT Provider Note   CSN: [id number] Arrival date & time: 05/30/17  1401     History   Chief Complaint Chief Complaint  Patient presents with  . Dental Pain    HPI Shaun Fisher is a 81 y.o. male.  HPI Patient presents to the emergency room for evaluation of a toothache in the left lower molar.  Patient has a history of known small cell cancer of the lung.  Patient lives in [place] and is down in [place] visiting his son.  Patient has been receiving all his treatments in [place].  Patient states when he started having his toothache he called the hospice nurse and they recommended he come to the emergency room to be evaluated.  Patient states that it hurts to chew.  He denies any difficulty swallowing.  He has noticed some increased difficulty breathing whenever he tries to walk around over the last few days.  He feels better when he is at rest.  He denies any fevers or chills.  No chest pain.  No swelling or weakness.  Right now he is actually feeling a bit better and is tooth is not bothering him.  He is not feeling short of breath at rest.  He does have oxygen available to him at home but did not bring it with him the emergency department Past Medical History:  Diagnosis Date  . Black lung disease ([place]) 10/02/2016  . BPH (benign prostatic hyperplasia) 10/02/2016  . COPD (chronic obstructive pulmonary disease) ([place])   . Emphysema of lung ([place])   . GERD (gastroesophageal reflux disease)   . Non-small cell lung cancer ([place]) 10/02/2016  . Substance abuse ([place])    tobacco    Patient Active Problem List   Diagnosis Date Noted  . Non-small cell lung cancer ([place]) 10/02/2016  . BPH (benign prostatic hyperplasia) 10/02/2016  . Black lung disease ([place]) 10/02/2016  . Gastroesophageal reflux disease 10/02/2016    Past Surgical History:  Procedure Laterality Date  . PARTIAL COLECTOMY         Home Medications    Prior to Admission  medications   Medication Sig Start Date End Date Taking? Authorizing Provider  albuterol (PROVENTIL HFA;VENTOLIN HFA) 108 (90 Base) MCG/ACT inhaler Inhale 2 puffs into the lungs every 6 (six) hours as needed for wheezing or shortness of breath. 10/02/16   [facility], PA-C  amoxicillin (AMOXIL) 500 MG capsule Take 1 capsule (500 mg total) by mouth 3 (three) times daily. 05/30/17   Dorie Rank, MD  azithromycin (ZITHROMAX) 250 MG tablet Take 1 tablet (250 mg total) by mouth daily. Take 1 tablet daily until finished starting on November 3 05/31/17   Dorie Rank, MD  HYDROcodone-homatropine [facility]) 5-1.5 MG/5ML syrup Take 5 mLs by mouth every 6 (six) hours as needed for cough. 10/31/16   Twana First, MD  KLOR-CON M20 20 MEQ tablet TAKE 1 TABLET (20 MEQ TOTAL) BY MOUTH 2 (TWO) TIMES DAILY. 12/29/16   [facility], PA-C  lidocaine-prilocaine (EMLA) cream Apply 1 application topically as needed. 10/02/16   [facility] Cancer, PA-C  omeprazole (PRILOSEC) 20 MG capsule Take 1 capsule (20 mg total) by mouth daily. 10/31/16   Twana First, MD  triamcinolone cream (KENALOG) 0.5 % APPLY TO AFFECTED AREA 2 TIMES A DAY 11/06/16   [provider]    Family History History reviewed. No pertinent family history.  Social History Social History  Substance Use Topics  . Smoking status: Current Every  Day Smoker    Packs/day: 0.50    Years: 60.00    Types: Cigars  . Smokeless tobacco: Never Used  . Alcohol use No     Allergies   Patient has no known allergies.   Review of Systems Review of Systems  All other systems reviewed and are negative.    Physical Exam Updated Vital Signs BP (!) 123/58   Pulse 62   Temp 98.4 F (36.9 C) (Oral)   Resp 18   Ht 1.727 m (5\' 8" )   Wt 57.2 kg (126 lb)   SpO2 98%   BMI 19.16 kg/m   Physical Exam  Constitutional: He appears well-developed and well-nourished. No distress.  HENT:  Head: Normocephalic and atraumatic.  Right Ear: External ear  normal.  Left Ear: External ear normal.  Tenderness palpation left lower most posterior molar, evidence of prior dental caries with a filling, no purulent drainage, no swelling noted  Eyes: Conjunctivae are normal. Right eye exhibits no discharge. Left eye exhibits no discharge. No scleral icterus.  Neck: Neck supple. No tracheal deviation present.  Cardiovascular: Normal rate, regular rhythm and intact distal pulses.   Pulmonary/Chest: Effort normal and breath sounds normal. No stridor. No respiratory distress. He has no wheezes. He has no rales.  Abdominal: Soft. Bowel sounds are normal. He exhibits no distension. There is no tenderness. There is no rebound and no guarding.  Musculoskeletal: He exhibits no edema or tenderness.  Neurological: He is alert. He has normal strength. No cranial nerve deficit (no facial droop, extraocular movements intact, no slurred speech) or sensory deficit. He exhibits normal muscle tone. He displays no seizure activity. Coordination normal.  Skin: Skin is warm and dry. No rash noted.  Psychiatric: He has a normal mood and affect.  Nursing note and vitals reviewed.    ED Treatments / Results  Labs (all labs ordered are listed, but only abnormal results are displayed) Labs Reviewed  CBC WITH DIFFERENTIAL/PLATELET - Abnormal; Notable for the following:       Result Value   WBC 10.7 (*)    RBC 4.17 (*)    Hemoglobin 12.7 (*)    HCT 38.7 (*)    RDW 15.8 (*)    Platelets 146 (*)    Neutro Abs 8.2 (*)    All other components within normal limits  COMPREHENSIVE METABOLIC PANEL - Abnormal; Notable for the following:    BUN 21 (*)    Calcium 8.4 (*)    Total Protein 6.3 (*)    Albumin 3.1 (*)    All other components within normal limits    EKG  EKG Interpretation None       Radiology Dg Chest 2 View  Result Date: 05/30/2017 CLINICAL DATA:  Cough.  Shortness of breath. EXAM: CHEST  2 VIEW COMPARISON:  No recent . FINDINGS: Port-A-Cath noted with  lead tip over the superior vena cava. Heart size normal. Patchy bilateral pulmonary infiltrates most consistent pneumonia. Follow-up chest x-rays recommended demonstrate complete clearing to exclude underlying mass lesions. Small left pleural effusion. No pneumothorax. Heart size normal. IMPRESSION: Patchy pulmonary infiltrates noted bilaterally most consistent with pneumonia. Close follow-up chest x-rays recommended demonstrate clearing to exclude underlying mass lesions. Electronically Signed   By: Marcello Moores  Register   On: 05/30/2017 15:35    Procedures Procedures (including critical care time)  Medications Ordered in ED Medications  amoxicillin (AMOXIL) capsule 500 mg (not administered)  azithromycin (ZITHROMAX) tablet 500 mg (not administered)  Initial Impression / Assessment and Plan / ED Course  I have reviewed the triage vital signs and the nursing notes.  Pertinent labs & imaging results that were available during my care of the patient were reviewed by me and considered in my medical decision making (see chart for details).   I reviewed the notes from the patient's visit with his oncologist on January 08, 2017 with Dr. Oliva Bustard.  Patient has metastatic lung adenocarcinoma to the lymph nodes and collateral lung.  Patient progressed on first and second line treatment.  His oncologist discussed with him the next option.  Patient ultimately decided that he wanted to stop treatment and continue with palliative care and eventually hospice.    Patient's chest x-ray results suggest the possibility of pneumonia.  This is certainly possible although no prior images are available in our system and I wonder if these infiltrates may be his metastatic lung CA.  Patient states he does not feel like he needs to be hospitalized.  We are checking basic labs.  I plan on started on antibiotics to cover for dental infection as well as possible lung infection.  No significant abnormalities noted on his  laboratory tests.  Patient continues to be breathing easily.  Patient was given a dose of antibiotics here in the emergency room.  Discharged home with amoxicillin and azithromycin to cover both pneumonia and a dental infection     Final Clinical Impressions(s) / ED Diagnoses   Final diagnoses:  Dental caries    New Prescriptions New Prescriptions   AMOXICILLIN (AMOXIL) 500 MG CAPSULE    Take 1 capsule (500 mg total) by mouth 3 (three) times daily.   AZITHROMYCIN (ZITHROMAX) 250 MG TABLET    Take 1 tablet (250 mg total) by mouth daily. Take 1 tablet daily until finished starting on November 3     Dorie Rank, MD 05/30/17 [phone]

## 2017-05-30 NOTE — Discharge Instructions (Signed)
Take the antibiotics as prescribed.  Follow-up with a dentist as soon as you are able.  Return as needed for worsening symptoms

## 2017-05-30 NOTE — ED Notes (Signed)
Patient also reports some coughing and "more shortness of breath than usual." EDP aware.

## 2017-05-30 NOTE — ED Triage Notes (Signed)
Patient c/o left lower dental pain that started on Friday. Denies any fevers. Patient has stage 4 lung cancer and is on hospice. Patient told to come here and be treated by hospice nurse.

## 2017-11-26 DEATH — deceased

## 2018-02-25 IMAGING — DX DG CHEST 2V
2 series · 2 of 2 positions shown · non-contrast
Comparison: No recent .

CLINICAL DATA: Cough.  Shortness of breath.

EXAM:
CHEST  2 VIEW

[chest pa]
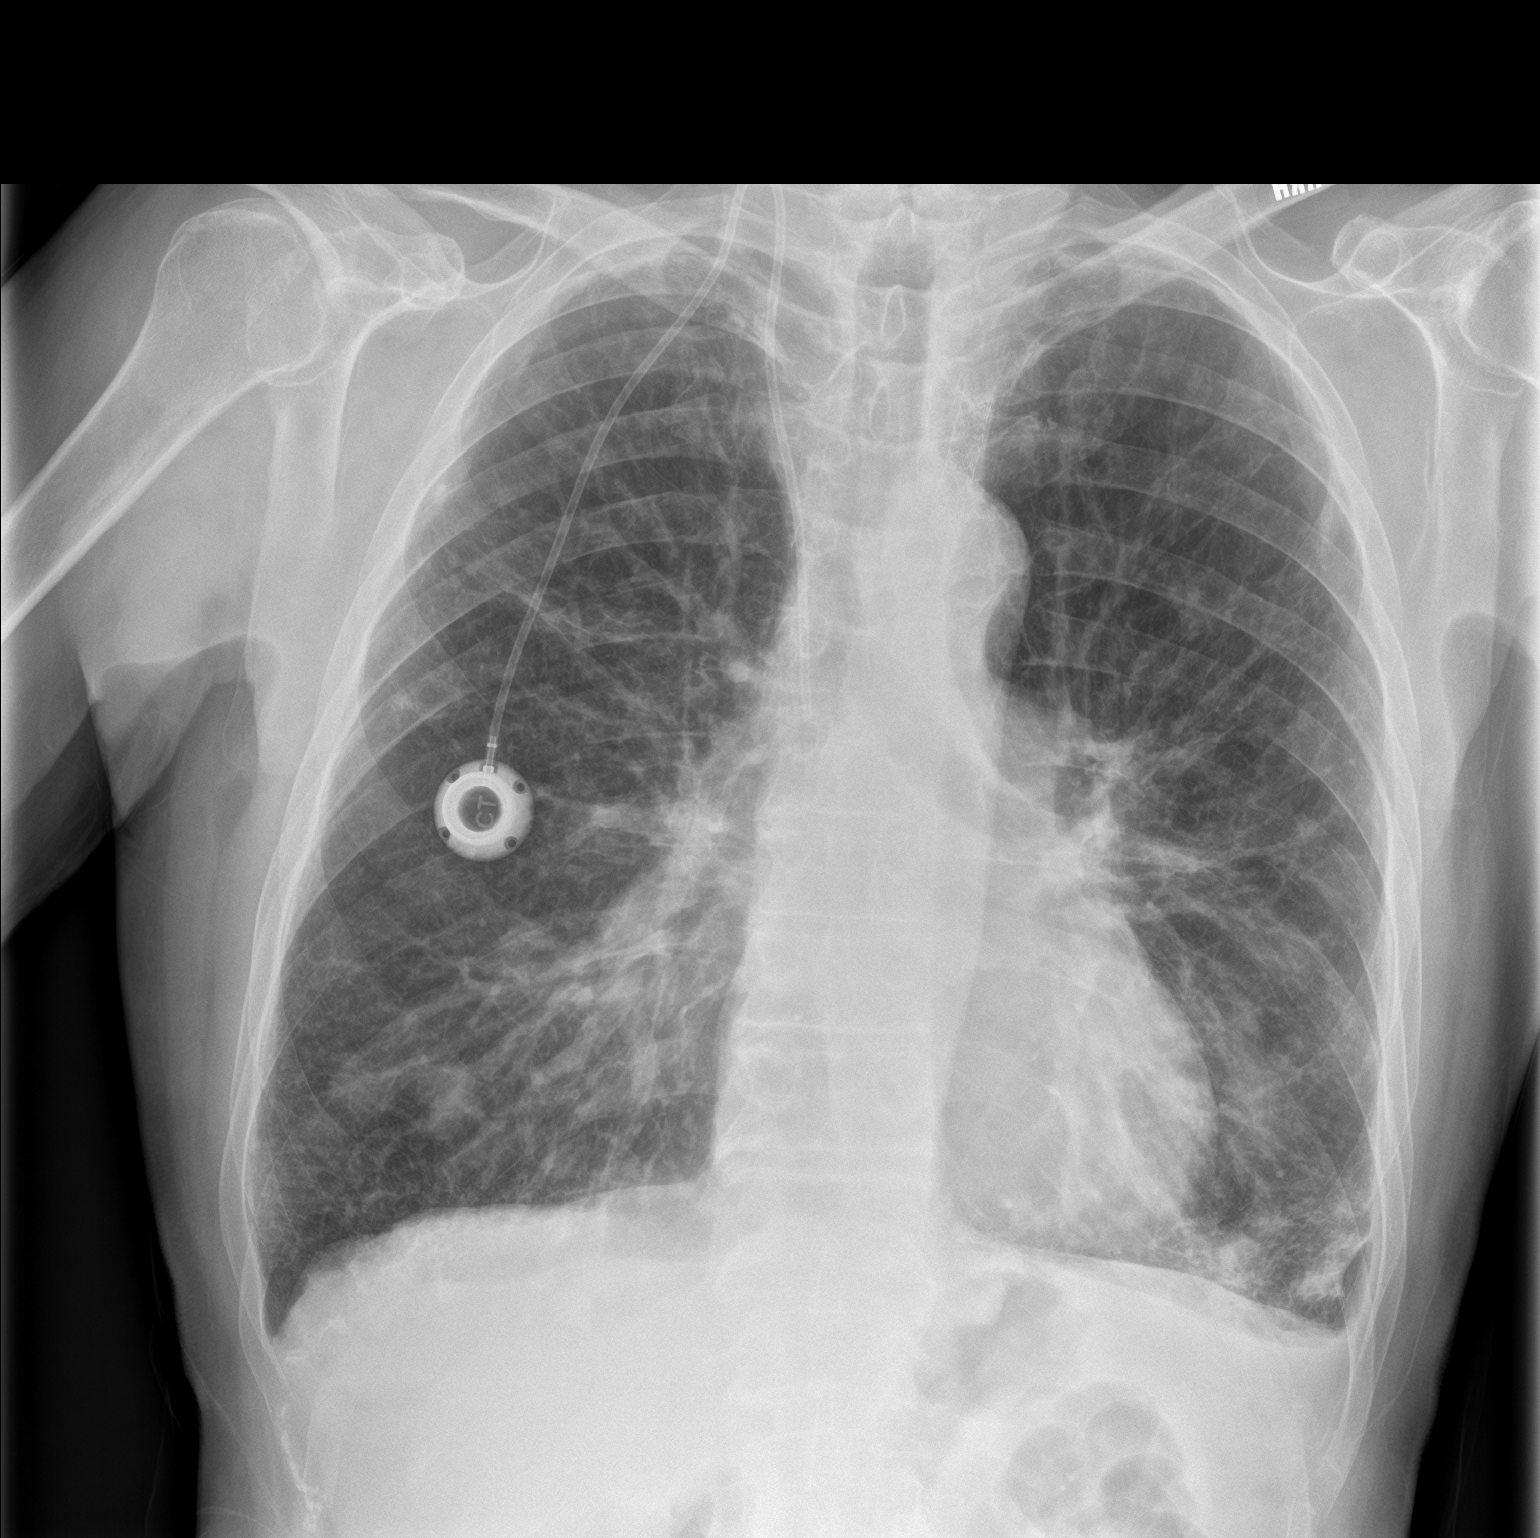

[chest lat]
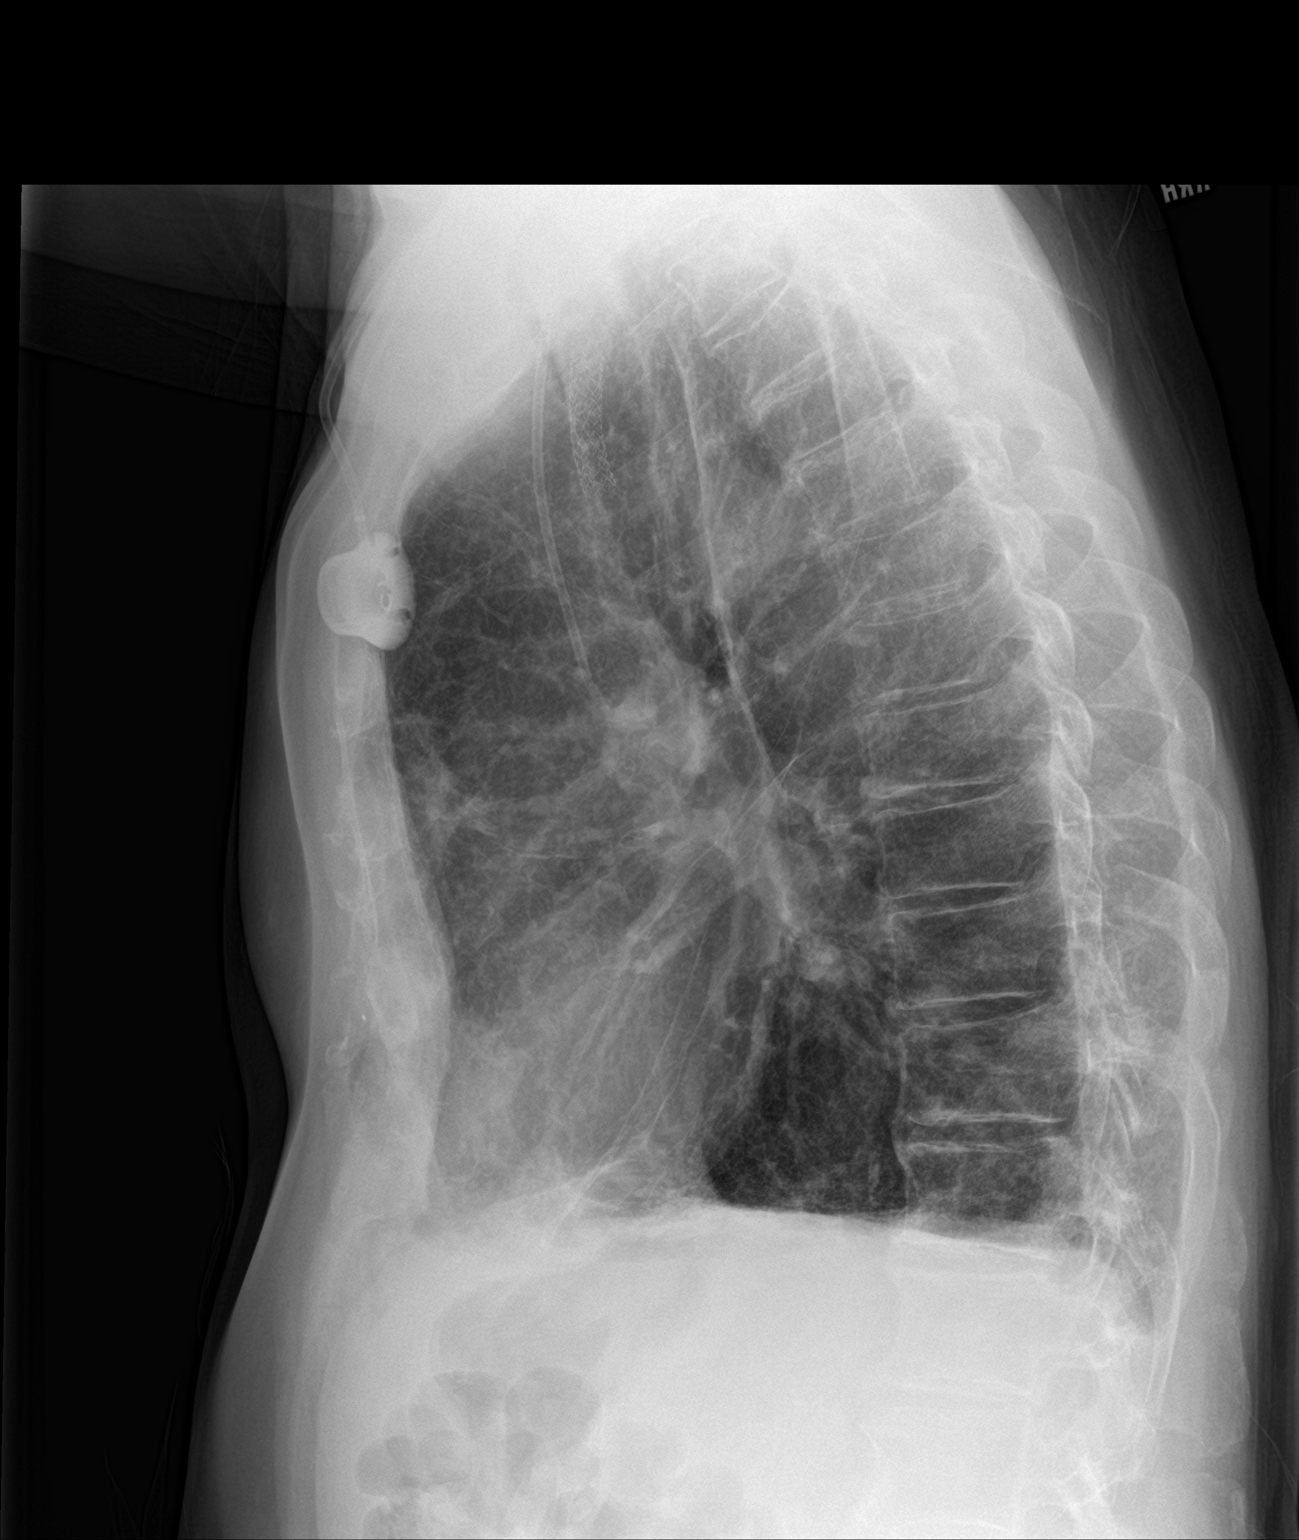

[2 of 2 positions shown; findings below may reference images not displayed]

FINDINGS: Port-A-Cath noted with lead tip over the superior vena cava. Heart
size normal. Patchy bilateral pulmonary infiltrates most consistent
pneumonia. Follow-up chest x-rays recommended demonstrate complete
clearing to exclude underlying mass lesions. Small left pleural
effusion. No pneumothorax. Heart size normal.
IMPRESSION: Patchy pulmonary infiltrates noted bilaterally most consistent with
pneumonia. Close follow-up chest x-rays recommended demonstrate
clearing to exclude underlying mass lesions.
